# Patient Record
Sex: Female | Born: 1935
Health system: Southern US, Community
[De-identification: ages and names within clinical notes are randomized; demographics above are authoritative.]

## PROBLEM LIST (undated history)

## (undated) DIAGNOSIS — M1990 Unspecified osteoarthritis, unspecified site: Secondary | ICD-10-CM

## (undated) DIAGNOSIS — I10 Essential (primary) hypertension: Secondary | ICD-10-CM

## (undated) DIAGNOSIS — E119 Type 2 diabetes mellitus without complications: Secondary | ICD-10-CM

## (undated) HISTORY — PX: LUNG SURGERY: SHX703

## (undated) HISTORY — PX: SHOULDER SURGERY: SHX246

## (undated) HISTORY — PX: KNEE SURGERY: SHX244

## (undated) HISTORY — PX: HIP SURGERY: SHX245

## (undated) HISTORY — DX: Essential (primary) hypertension: I10

## (undated) HISTORY — PX: ABDOMINAL HYSTERECTOMY: SHX81

## (undated) HISTORY — DX: Unspecified osteoarthritis, unspecified site: M19.90

## (undated) HISTORY — DX: Type 2 diabetes mellitus without complications: E11.9

---

## 2014-08-22 DIAGNOSIS — I10 Essential (primary) hypertension: Secondary | ICD-10-CM | POA: Diagnosis not present

## 2014-08-22 DIAGNOSIS — M1990 Unspecified osteoarthritis, unspecified site: Secondary | ICD-10-CM | POA: Diagnosis not present

## 2014-08-22 DIAGNOSIS — E119 Type 2 diabetes mellitus without complications: Secondary | ICD-10-CM | POA: Diagnosis not present

## 2014-08-22 DIAGNOSIS — E785 Hyperlipidemia, unspecified: Secondary | ICD-10-CM | POA: Diagnosis not present

## 2015-02-22 DIAGNOSIS — E119 Type 2 diabetes mellitus without complications: Secondary | ICD-10-CM | POA: Diagnosis not present

## 2015-02-22 DIAGNOSIS — E785 Hyperlipidemia, unspecified: Secondary | ICD-10-CM | POA: Diagnosis not present

## 2015-02-22 DIAGNOSIS — Z23 Encounter for immunization: Secondary | ICD-10-CM | POA: Diagnosis not present

## 2015-02-22 DIAGNOSIS — E559 Vitamin D deficiency, unspecified: Secondary | ICD-10-CM | POA: Diagnosis not present

## 2015-02-22 DIAGNOSIS — R799 Abnormal finding of blood chemistry, unspecified: Secondary | ICD-10-CM | POA: Diagnosis not present

## 2015-02-22 DIAGNOSIS — M150 Primary generalized (osteo)arthritis: Secondary | ICD-10-CM | POA: Diagnosis not present

## 2015-02-22 DIAGNOSIS — I10 Essential (primary) hypertension: Secondary | ICD-10-CM | POA: Diagnosis not present

## 2015-03-28 DIAGNOSIS — I10 Essential (primary) hypertension: Secondary | ICD-10-CM | POA: Diagnosis not present

## 2015-03-28 DIAGNOSIS — E119 Type 2 diabetes mellitus without complications: Secondary | ICD-10-CM | POA: Diagnosis not present

## 2015-03-28 DIAGNOSIS — M25562 Pain in left knee: Secondary | ICD-10-CM | POA: Diagnosis not present

## 2015-03-28 DIAGNOSIS — M25561 Pain in right knee: Secondary | ICD-10-CM | POA: Diagnosis not present

## 2015-04-17 DIAGNOSIS — E119 Type 2 diabetes mellitus without complications: Secondary | ICD-10-CM | POA: Diagnosis not present

## 2015-04-17 DIAGNOSIS — R350 Frequency of micturition: Secondary | ICD-10-CM | POA: Diagnosis not present

## 2015-04-17 DIAGNOSIS — Z7984 Long term (current) use of oral hypoglycemic drugs: Secondary | ICD-10-CM | POA: Diagnosis not present

## 2015-04-17 DIAGNOSIS — N390 Urinary tract infection, site not specified: Secondary | ICD-10-CM | POA: Diagnosis not present

## 2015-06-08 DIAGNOSIS — Z789 Other specified health status: Secondary | ICD-10-CM | POA: Diagnosis not present

## 2015-06-08 DIAGNOSIS — R2689 Other abnormalities of gait and mobility: Secondary | ICD-10-CM | POA: Diagnosis not present

## 2015-06-08 DIAGNOSIS — R262 Difficulty in walking, not elsewhere classified: Secondary | ICD-10-CM | POA: Diagnosis not present

## 2015-06-08 DIAGNOSIS — M150 Primary generalized (osteo)arthritis: Secondary | ICD-10-CM | POA: Diagnosis not present

## 2015-08-30 DIAGNOSIS — Z79899 Other long term (current) drug therapy: Secondary | ICD-10-CM | POA: Diagnosis not present

## 2015-08-30 DIAGNOSIS — Z7984 Long term (current) use of oral hypoglycemic drugs: Secondary | ICD-10-CM | POA: Diagnosis not present

## 2015-08-30 DIAGNOSIS — I10 Essential (primary) hypertension: Secondary | ICD-10-CM | POA: Diagnosis not present

## 2015-08-30 DIAGNOSIS — E7800 Pure hypercholesterolemia, unspecified: Secondary | ICD-10-CM | POA: Diagnosis not present

## 2015-08-30 DIAGNOSIS — M150 Primary generalized (osteo)arthritis: Secondary | ICD-10-CM | POA: Diagnosis not present

## 2015-08-30 DIAGNOSIS — R2689 Other abnormalities of gait and mobility: Secondary | ICD-10-CM | POA: Diagnosis not present

## 2015-08-30 DIAGNOSIS — E119 Type 2 diabetes mellitus without complications: Secondary | ICD-10-CM | POA: Diagnosis not present

## 2015-09-06 DIAGNOSIS — E119 Type 2 diabetes mellitus without complications: Secondary | ICD-10-CM | POA: Diagnosis not present

## 2015-09-06 DIAGNOSIS — Z7984 Long term (current) use of oral hypoglycemic drugs: Secondary | ICD-10-CM | POA: Diagnosis not present

## 2015-09-06 DIAGNOSIS — R2689 Other abnormalities of gait and mobility: Secondary | ICD-10-CM | POA: Diagnosis not present

## 2015-09-06 DIAGNOSIS — M160 Bilateral primary osteoarthritis of hip: Secondary | ICD-10-CM | POA: Diagnosis not present

## 2015-09-06 DIAGNOSIS — M170 Bilateral primary osteoarthritis of knee: Secondary | ICD-10-CM | POA: Diagnosis not present

## 2015-09-06 DIAGNOSIS — M4806 Spinal stenosis, lumbar region: Secondary | ICD-10-CM | POA: Diagnosis not present

## 2015-09-06 DIAGNOSIS — I10 Essential (primary) hypertension: Secondary | ICD-10-CM | POA: Diagnosis not present

## 2015-09-12 DIAGNOSIS — E119 Type 2 diabetes mellitus without complications: Secondary | ICD-10-CM | POA: Diagnosis not present

## 2015-09-12 DIAGNOSIS — I10 Essential (primary) hypertension: Secondary | ICD-10-CM | POA: Diagnosis not present

## 2015-09-12 DIAGNOSIS — R2689 Other abnormalities of gait and mobility: Secondary | ICD-10-CM | POA: Diagnosis not present

## 2015-09-12 DIAGNOSIS — M4806 Spinal stenosis, lumbar region: Secondary | ICD-10-CM | POA: Diagnosis not present

## 2015-09-12 DIAGNOSIS — M160 Bilateral primary osteoarthritis of hip: Secondary | ICD-10-CM | POA: Diagnosis not present

## 2015-09-12 DIAGNOSIS — M170 Bilateral primary osteoarthritis of knee: Secondary | ICD-10-CM | POA: Diagnosis not present

## 2015-09-21 DIAGNOSIS — I10 Essential (primary) hypertension: Secondary | ICD-10-CM | POA: Diagnosis not present

## 2015-09-21 DIAGNOSIS — M160 Bilateral primary osteoarthritis of hip: Secondary | ICD-10-CM | POA: Diagnosis not present

## 2015-09-21 DIAGNOSIS — R2689 Other abnormalities of gait and mobility: Secondary | ICD-10-CM | POA: Diagnosis not present

## 2015-09-21 DIAGNOSIS — M170 Bilateral primary osteoarthritis of knee: Secondary | ICD-10-CM | POA: Diagnosis not present

## 2015-09-21 DIAGNOSIS — M4806 Spinal stenosis, lumbar region: Secondary | ICD-10-CM | POA: Diagnosis not present

## 2015-09-21 DIAGNOSIS — E119 Type 2 diabetes mellitus without complications: Secondary | ICD-10-CM | POA: Diagnosis not present

## 2015-09-22 DIAGNOSIS — I10 Essential (primary) hypertension: Secondary | ICD-10-CM | POA: Diagnosis not present

## 2015-09-22 DIAGNOSIS — M4806 Spinal stenosis, lumbar region: Secondary | ICD-10-CM | POA: Diagnosis not present

## 2015-09-22 DIAGNOSIS — M170 Bilateral primary osteoarthritis of knee: Secondary | ICD-10-CM | POA: Diagnosis not present

## 2015-09-22 DIAGNOSIS — E119 Type 2 diabetes mellitus without complications: Secondary | ICD-10-CM | POA: Diagnosis not present

## 2015-09-22 DIAGNOSIS — R2689 Other abnormalities of gait and mobility: Secondary | ICD-10-CM | POA: Diagnosis not present

## 2015-09-22 DIAGNOSIS — M160 Bilateral primary osteoarthritis of hip: Secondary | ICD-10-CM | POA: Diagnosis not present

## 2015-09-25 DIAGNOSIS — M4806 Spinal stenosis, lumbar region: Secondary | ICD-10-CM | POA: Diagnosis not present

## 2015-09-25 DIAGNOSIS — M160 Bilateral primary osteoarthritis of hip: Secondary | ICD-10-CM | POA: Diagnosis not present

## 2015-09-25 DIAGNOSIS — R2689 Other abnormalities of gait and mobility: Secondary | ICD-10-CM | POA: Diagnosis not present

## 2015-09-25 DIAGNOSIS — M170 Bilateral primary osteoarthritis of knee: Secondary | ICD-10-CM | POA: Diagnosis not present

## 2015-09-25 DIAGNOSIS — I10 Essential (primary) hypertension: Secondary | ICD-10-CM | POA: Diagnosis not present

## 2015-09-25 DIAGNOSIS — E119 Type 2 diabetes mellitus without complications: Secondary | ICD-10-CM | POA: Diagnosis not present

## 2015-09-28 DIAGNOSIS — R2689 Other abnormalities of gait and mobility: Secondary | ICD-10-CM | POA: Diagnosis not present

## 2015-09-28 DIAGNOSIS — I10 Essential (primary) hypertension: Secondary | ICD-10-CM | POA: Diagnosis not present

## 2015-09-28 DIAGNOSIS — M170 Bilateral primary osteoarthritis of knee: Secondary | ICD-10-CM | POA: Diagnosis not present

## 2015-09-28 DIAGNOSIS — M160 Bilateral primary osteoarthritis of hip: Secondary | ICD-10-CM | POA: Diagnosis not present

## 2015-09-28 DIAGNOSIS — E119 Type 2 diabetes mellitus without complications: Secondary | ICD-10-CM | POA: Diagnosis not present

## 2015-09-28 DIAGNOSIS — M4806 Spinal stenosis, lumbar region: Secondary | ICD-10-CM | POA: Diagnosis not present

## 2015-09-29 DIAGNOSIS — E119 Type 2 diabetes mellitus without complications: Secondary | ICD-10-CM | POA: Diagnosis not present

## 2015-09-29 DIAGNOSIS — Z7984 Long term (current) use of oral hypoglycemic drugs: Secondary | ICD-10-CM | POA: Diagnosis not present

## 2015-09-29 DIAGNOSIS — I10 Essential (primary) hypertension: Secondary | ICD-10-CM | POA: Diagnosis not present

## 2015-09-29 DIAGNOSIS — M150 Primary generalized (osteo)arthritis: Secondary | ICD-10-CM | POA: Diagnosis not present

## 2015-09-29 DIAGNOSIS — R2689 Other abnormalities of gait and mobility: Secondary | ICD-10-CM | POA: Diagnosis not present

## 2015-10-02 DIAGNOSIS — R2689 Other abnormalities of gait and mobility: Secondary | ICD-10-CM | POA: Diagnosis not present

## 2015-10-02 DIAGNOSIS — M160 Bilateral primary osteoarthritis of hip: Secondary | ICD-10-CM | POA: Diagnosis not present

## 2015-10-02 DIAGNOSIS — I10 Essential (primary) hypertension: Secondary | ICD-10-CM | POA: Diagnosis not present

## 2015-10-02 DIAGNOSIS — M4806 Spinal stenosis, lumbar region: Secondary | ICD-10-CM | POA: Diagnosis not present

## 2015-10-02 DIAGNOSIS — E119 Type 2 diabetes mellitus without complications: Secondary | ICD-10-CM | POA: Diagnosis not present

## 2015-10-02 DIAGNOSIS — M170 Bilateral primary osteoarthritis of knee: Secondary | ICD-10-CM | POA: Diagnosis not present

## 2015-10-04 DIAGNOSIS — M160 Bilateral primary osteoarthritis of hip: Secondary | ICD-10-CM | POA: Diagnosis not present

## 2015-10-04 DIAGNOSIS — I10 Essential (primary) hypertension: Secondary | ICD-10-CM | POA: Diagnosis not present

## 2015-10-04 DIAGNOSIS — R2689 Other abnormalities of gait and mobility: Secondary | ICD-10-CM | POA: Diagnosis not present

## 2015-10-04 DIAGNOSIS — E119 Type 2 diabetes mellitus without complications: Secondary | ICD-10-CM | POA: Diagnosis not present

## 2015-10-04 DIAGNOSIS — M170 Bilateral primary osteoarthritis of knee: Secondary | ICD-10-CM | POA: Diagnosis not present

## 2015-10-04 DIAGNOSIS — M4806 Spinal stenosis, lumbar region: Secondary | ICD-10-CM | POA: Diagnosis not present

## 2015-10-09 DIAGNOSIS — M160 Bilateral primary osteoarthritis of hip: Secondary | ICD-10-CM | POA: Diagnosis not present

## 2015-10-09 DIAGNOSIS — I10 Essential (primary) hypertension: Secondary | ICD-10-CM | POA: Diagnosis not present

## 2015-10-09 DIAGNOSIS — M4806 Spinal stenosis, lumbar region: Secondary | ICD-10-CM | POA: Diagnosis not present

## 2015-10-09 DIAGNOSIS — R2689 Other abnormalities of gait and mobility: Secondary | ICD-10-CM | POA: Diagnosis not present

## 2015-10-09 DIAGNOSIS — M170 Bilateral primary osteoarthritis of knee: Secondary | ICD-10-CM | POA: Diagnosis not present

## 2015-10-09 DIAGNOSIS — E119 Type 2 diabetes mellitus without complications: Secondary | ICD-10-CM | POA: Diagnosis not present

## 2015-10-11 DIAGNOSIS — M160 Bilateral primary osteoarthritis of hip: Secondary | ICD-10-CM | POA: Diagnosis not present

## 2015-10-11 DIAGNOSIS — R2689 Other abnormalities of gait and mobility: Secondary | ICD-10-CM | POA: Diagnosis not present

## 2015-10-11 DIAGNOSIS — M170 Bilateral primary osteoarthritis of knee: Secondary | ICD-10-CM | POA: Diagnosis not present

## 2015-10-11 DIAGNOSIS — I10 Essential (primary) hypertension: Secondary | ICD-10-CM | POA: Diagnosis not present

## 2015-10-11 DIAGNOSIS — M4806 Spinal stenosis, lumbar region: Secondary | ICD-10-CM | POA: Diagnosis not present

## 2015-10-11 DIAGNOSIS — E119 Type 2 diabetes mellitus without complications: Secondary | ICD-10-CM | POA: Diagnosis not present

## 2015-10-16 DIAGNOSIS — M4806 Spinal stenosis, lumbar region: Secondary | ICD-10-CM | POA: Diagnosis not present

## 2015-10-16 DIAGNOSIS — E119 Type 2 diabetes mellitus without complications: Secondary | ICD-10-CM | POA: Diagnosis not present

## 2015-10-16 DIAGNOSIS — M170 Bilateral primary osteoarthritis of knee: Secondary | ICD-10-CM | POA: Diagnosis not present

## 2015-10-16 DIAGNOSIS — I10 Essential (primary) hypertension: Secondary | ICD-10-CM | POA: Diagnosis not present

## 2015-10-16 DIAGNOSIS — R2689 Other abnormalities of gait and mobility: Secondary | ICD-10-CM | POA: Diagnosis not present

## 2015-10-16 DIAGNOSIS — M160 Bilateral primary osteoarthritis of hip: Secondary | ICD-10-CM | POA: Diagnosis not present

## 2015-10-18 DIAGNOSIS — M4806 Spinal stenosis, lumbar region: Secondary | ICD-10-CM | POA: Diagnosis not present

## 2015-10-18 DIAGNOSIS — E119 Type 2 diabetes mellitus without complications: Secondary | ICD-10-CM | POA: Diagnosis not present

## 2015-10-18 DIAGNOSIS — R2689 Other abnormalities of gait and mobility: Secondary | ICD-10-CM | POA: Diagnosis not present

## 2015-10-18 DIAGNOSIS — I10 Essential (primary) hypertension: Secondary | ICD-10-CM | POA: Diagnosis not present

## 2015-10-18 DIAGNOSIS — M160 Bilateral primary osteoarthritis of hip: Secondary | ICD-10-CM | POA: Diagnosis not present

## 2015-10-18 DIAGNOSIS — M170 Bilateral primary osteoarthritis of knee: Secondary | ICD-10-CM | POA: Diagnosis not present

## 2015-10-19 DIAGNOSIS — R2689 Other abnormalities of gait and mobility: Secondary | ICD-10-CM | POA: Diagnosis not present

## 2015-10-19 DIAGNOSIS — M160 Bilateral primary osteoarthritis of hip: Secondary | ICD-10-CM | POA: Diagnosis not present

## 2015-10-19 DIAGNOSIS — E119 Type 2 diabetes mellitus without complications: Secondary | ICD-10-CM | POA: Diagnosis not present

## 2015-10-19 DIAGNOSIS — I10 Essential (primary) hypertension: Secondary | ICD-10-CM | POA: Diagnosis not present

## 2015-10-19 DIAGNOSIS — M4806 Spinal stenosis, lumbar region: Secondary | ICD-10-CM | POA: Diagnosis not present

## 2015-10-19 DIAGNOSIS — M170 Bilateral primary osteoarthritis of knee: Secondary | ICD-10-CM | POA: Diagnosis not present

## 2015-10-23 DIAGNOSIS — M4806 Spinal stenosis, lumbar region: Secondary | ICD-10-CM | POA: Diagnosis not present

## 2015-10-23 DIAGNOSIS — M160 Bilateral primary osteoarthritis of hip: Secondary | ICD-10-CM | POA: Diagnosis not present

## 2015-10-23 DIAGNOSIS — I10 Essential (primary) hypertension: Secondary | ICD-10-CM | POA: Diagnosis not present

## 2015-10-23 DIAGNOSIS — R2689 Other abnormalities of gait and mobility: Secondary | ICD-10-CM | POA: Diagnosis not present

## 2015-10-23 DIAGNOSIS — E119 Type 2 diabetes mellitus without complications: Secondary | ICD-10-CM | POA: Diagnosis not present

## 2015-10-23 DIAGNOSIS — M170 Bilateral primary osteoarthritis of knee: Secondary | ICD-10-CM | POA: Diagnosis not present

## 2015-10-25 DIAGNOSIS — R2689 Other abnormalities of gait and mobility: Secondary | ICD-10-CM | POA: Diagnosis not present

## 2015-10-25 DIAGNOSIS — E119 Type 2 diabetes mellitus without complications: Secondary | ICD-10-CM | POA: Diagnosis not present

## 2015-10-25 DIAGNOSIS — M160 Bilateral primary osteoarthritis of hip: Secondary | ICD-10-CM | POA: Diagnosis not present

## 2015-10-25 DIAGNOSIS — I10 Essential (primary) hypertension: Secondary | ICD-10-CM | POA: Diagnosis not present

## 2015-10-25 DIAGNOSIS — M4806 Spinal stenosis, lumbar region: Secondary | ICD-10-CM | POA: Diagnosis not present

## 2015-10-25 DIAGNOSIS — M170 Bilateral primary osteoarthritis of knee: Secondary | ICD-10-CM | POA: Diagnosis not present

## 2015-10-30 DIAGNOSIS — E119 Type 2 diabetes mellitus without complications: Secondary | ICD-10-CM | POA: Diagnosis not present

## 2015-10-30 DIAGNOSIS — R2689 Other abnormalities of gait and mobility: Secondary | ICD-10-CM | POA: Diagnosis not present

## 2015-10-30 DIAGNOSIS — M170 Bilateral primary osteoarthritis of knee: Secondary | ICD-10-CM | POA: Diagnosis not present

## 2015-10-30 DIAGNOSIS — M4806 Spinal stenosis, lumbar region: Secondary | ICD-10-CM | POA: Diagnosis not present

## 2015-10-30 DIAGNOSIS — M160 Bilateral primary osteoarthritis of hip: Secondary | ICD-10-CM | POA: Diagnosis not present

## 2015-10-30 DIAGNOSIS — I10 Essential (primary) hypertension: Secondary | ICD-10-CM | POA: Diagnosis not present

## 2015-11-01 DIAGNOSIS — M4806 Spinal stenosis, lumbar region: Secondary | ICD-10-CM | POA: Diagnosis not present

## 2015-11-01 DIAGNOSIS — I10 Essential (primary) hypertension: Secondary | ICD-10-CM | POA: Diagnosis not present

## 2015-11-01 DIAGNOSIS — M170 Bilateral primary osteoarthritis of knee: Secondary | ICD-10-CM | POA: Diagnosis not present

## 2015-11-01 DIAGNOSIS — R2689 Other abnormalities of gait and mobility: Secondary | ICD-10-CM | POA: Diagnosis not present

## 2015-11-01 DIAGNOSIS — M160 Bilateral primary osteoarthritis of hip: Secondary | ICD-10-CM | POA: Diagnosis not present

## 2015-11-01 DIAGNOSIS — E119 Type 2 diabetes mellitus without complications: Secondary | ICD-10-CM | POA: Diagnosis not present

## 2015-11-02 DIAGNOSIS — M170 Bilateral primary osteoarthritis of knee: Secondary | ICD-10-CM | POA: Diagnosis not present

## 2015-11-02 DIAGNOSIS — M160 Bilateral primary osteoarthritis of hip: Secondary | ICD-10-CM | POA: Diagnosis not present

## 2015-11-02 DIAGNOSIS — I10 Essential (primary) hypertension: Secondary | ICD-10-CM | POA: Diagnosis not present

## 2015-11-02 DIAGNOSIS — E119 Type 2 diabetes mellitus without complications: Secondary | ICD-10-CM | POA: Diagnosis not present

## 2015-11-02 DIAGNOSIS — M4806 Spinal stenosis, lumbar region: Secondary | ICD-10-CM | POA: Diagnosis not present

## 2015-11-02 DIAGNOSIS — R2689 Other abnormalities of gait and mobility: Secondary | ICD-10-CM | POA: Diagnosis not present

## 2015-12-04 DIAGNOSIS — E119 Type 2 diabetes mellitus without complications: Secondary | ICD-10-CM | POA: Diagnosis not present

## 2015-12-04 DIAGNOSIS — Z7984 Long term (current) use of oral hypoglycemic drugs: Secondary | ICD-10-CM | POA: Diagnosis not present

## 2015-12-25 DIAGNOSIS — Z79899 Other long term (current) drug therapy: Secondary | ICD-10-CM | POA: Diagnosis not present

## 2015-12-25 DIAGNOSIS — I10 Essential (primary) hypertension: Secondary | ICD-10-CM | POA: Diagnosis not present

## 2015-12-25 DIAGNOSIS — E119 Type 2 diabetes mellitus without complications: Secondary | ICD-10-CM | POA: Diagnosis not present

## 2015-12-25 DIAGNOSIS — R600 Localized edema: Secondary | ICD-10-CM | POA: Diagnosis not present

## 2016-01-08 DIAGNOSIS — Z79899 Other long term (current) drug therapy: Secondary | ICD-10-CM | POA: Diagnosis not present

## 2016-02-25 DIAGNOSIS — Z23 Encounter for immunization: Secondary | ICD-10-CM | POA: Diagnosis not present

## 2016-03-05 DIAGNOSIS — E119 Type 2 diabetes mellitus without complications: Secondary | ICD-10-CM | POA: Diagnosis not present

## 2016-03-05 DIAGNOSIS — N951 Menopausal and female climacteric states: Secondary | ICD-10-CM | POA: Diagnosis not present

## 2016-03-05 DIAGNOSIS — Z79899 Other long term (current) drug therapy: Secondary | ICD-10-CM | POA: Diagnosis not present

## 2016-03-05 DIAGNOSIS — E7800 Pure hypercholesterolemia, unspecified: Secondary | ICD-10-CM | POA: Diagnosis not present

## 2016-03-05 DIAGNOSIS — Z0001 Encounter for general adult medical examination with abnormal findings: Secondary | ICD-10-CM | POA: Diagnosis not present

## 2016-03-05 DIAGNOSIS — I10 Essential (primary) hypertension: Secondary | ICD-10-CM | POA: Diagnosis not present

## 2016-03-05 DIAGNOSIS — M150 Primary generalized (osteo)arthritis: Secondary | ICD-10-CM | POA: Diagnosis not present

## 2016-03-05 DIAGNOSIS — Z7984 Long term (current) use of oral hypoglycemic drugs: Secondary | ICD-10-CM | POA: Diagnosis not present

## 2016-04-01 DIAGNOSIS — Z7984 Long term (current) use of oral hypoglycemic drugs: Secondary | ICD-10-CM | POA: Diagnosis not present

## 2016-04-01 DIAGNOSIS — R296 Repeated falls: Secondary | ICD-10-CM | POA: Diagnosis not present

## 2016-04-01 DIAGNOSIS — E119 Type 2 diabetes mellitus without complications: Secondary | ICD-10-CM | POA: Diagnosis not present

## 2016-04-01 DIAGNOSIS — Z9181 History of falling: Secondary | ICD-10-CM | POA: Diagnosis not present

## 2016-04-01 DIAGNOSIS — I10 Essential (primary) hypertension: Secondary | ICD-10-CM | POA: Diagnosis not present

## 2016-04-04 DIAGNOSIS — I10 Essential (primary) hypertension: Secondary | ICD-10-CM | POA: Diagnosis not present

## 2016-04-04 DIAGNOSIS — R296 Repeated falls: Secondary | ICD-10-CM | POA: Diagnosis not present

## 2016-04-05 DIAGNOSIS — R0602 Shortness of breath: Secondary | ICD-10-CM | POA: Diagnosis not present

## 2016-04-08 DIAGNOSIS — I10 Essential (primary) hypertension: Secondary | ICD-10-CM | POA: Diagnosis not present

## 2016-04-08 DIAGNOSIS — R296 Repeated falls: Secondary | ICD-10-CM | POA: Diagnosis not present

## 2016-04-09 ENCOUNTER — Other Ambulatory Visit: Payer: Self-pay | Admitting: Family Medicine

## 2016-04-09 ENCOUNTER — Ambulatory Visit
Admission: RE | Admit: 2016-04-09 | Discharge: 2016-04-09 | Disposition: A | Discharge status: Home / Self Care | Payer: Medicare Other | Source: Ambulatory Visit | Attending: Family Medicine | Admitting: Family Medicine

## 2016-04-09 DIAGNOSIS — R0602 Shortness of breath: Secondary | ICD-10-CM | POA: Diagnosis not present

## 2016-04-11 DIAGNOSIS — I10 Essential (primary) hypertension: Secondary | ICD-10-CM | POA: Diagnosis not present

## 2016-04-11 DIAGNOSIS — R296 Repeated falls: Secondary | ICD-10-CM | POA: Diagnosis not present

## 2016-04-15 DIAGNOSIS — R296 Repeated falls: Secondary | ICD-10-CM | POA: Diagnosis not present

## 2016-04-15 DIAGNOSIS — I10 Essential (primary) hypertension: Secondary | ICD-10-CM | POA: Diagnosis not present

## 2016-04-17 DIAGNOSIS — R296 Repeated falls: Secondary | ICD-10-CM | POA: Diagnosis not present

## 2016-04-17 DIAGNOSIS — I10 Essential (primary) hypertension: Secondary | ICD-10-CM | POA: Diagnosis not present

## 2016-04-22 DIAGNOSIS — L602 Onychogryphosis: Secondary | ICD-10-CM | POA: Diagnosis not present

## 2016-04-22 DIAGNOSIS — M216X2 Other acquired deformities of left foot: Secondary | ICD-10-CM | POA: Diagnosis not present

## 2016-04-22 DIAGNOSIS — B351 Tinea unguium: Secondary | ICD-10-CM | POA: Diagnosis not present

## 2016-04-22 DIAGNOSIS — R296 Repeated falls: Secondary | ICD-10-CM | POA: Diagnosis not present

## 2016-04-22 DIAGNOSIS — M216X1 Other acquired deformities of right foot: Secondary | ICD-10-CM | POA: Diagnosis not present

## 2016-04-22 DIAGNOSIS — E1151 Type 2 diabetes mellitus with diabetic peripheral angiopathy without gangrene: Secondary | ICD-10-CM | POA: Diagnosis not present

## 2016-04-22 DIAGNOSIS — I10 Essential (primary) hypertension: Secondary | ICD-10-CM | POA: Diagnosis not present

## 2016-04-24 DIAGNOSIS — I10 Essential (primary) hypertension: Secondary | ICD-10-CM | POA: Diagnosis not present

## 2016-04-24 DIAGNOSIS — R296 Repeated falls: Secondary | ICD-10-CM | POA: Diagnosis not present

## 2016-04-29 DIAGNOSIS — R296 Repeated falls: Secondary | ICD-10-CM | POA: Diagnosis not present

## 2016-04-29 DIAGNOSIS — I10 Essential (primary) hypertension: Secondary | ICD-10-CM | POA: Diagnosis not present

## 2016-05-01 DIAGNOSIS — B351 Tinea unguium: Secondary | ICD-10-CM | POA: Diagnosis not present

## 2016-05-01 DIAGNOSIS — M810 Age-related osteoporosis without current pathological fracture: Secondary | ICD-10-CM | POA: Diagnosis not present

## 2016-05-01 DIAGNOSIS — Z780 Asymptomatic menopausal state: Secondary | ICD-10-CM | POA: Diagnosis not present

## 2016-05-02 DIAGNOSIS — R296 Repeated falls: Secondary | ICD-10-CM | POA: Diagnosis not present

## 2016-05-02 DIAGNOSIS — I10 Essential (primary) hypertension: Secondary | ICD-10-CM | POA: Diagnosis not present

## 2016-05-07 DIAGNOSIS — I10 Essential (primary) hypertension: Secondary | ICD-10-CM | POA: Diagnosis not present

## 2016-05-09 DIAGNOSIS — I10 Essential (primary) hypertension: Secondary | ICD-10-CM | POA: Diagnosis not present

## 2016-05-09 DIAGNOSIS — R296 Repeated falls: Secondary | ICD-10-CM | POA: Diagnosis not present

## 2016-05-15 DIAGNOSIS — L602 Onychogryphosis: Secondary | ICD-10-CM | POA: Diagnosis not present

## 2016-05-15 DIAGNOSIS — E119 Type 2 diabetes mellitus without complications: Secondary | ICD-10-CM | POA: Diagnosis not present

## 2016-05-20 DIAGNOSIS — I10 Essential (primary) hypertension: Secondary | ICD-10-CM | POA: Diagnosis not present

## 2016-05-20 DIAGNOSIS — R296 Repeated falls: Secondary | ICD-10-CM | POA: Diagnosis not present

## 2016-05-30 DIAGNOSIS — R296 Repeated falls: Secondary | ICD-10-CM | POA: Diagnosis not present

## 2016-05-30 DIAGNOSIS — I10 Essential (primary) hypertension: Secondary | ICD-10-CM | POA: Diagnosis not present

## 2016-05-31 DIAGNOSIS — E119 Type 2 diabetes mellitus without complications: Secondary | ICD-10-CM | POA: Diagnosis not present

## 2016-05-31 DIAGNOSIS — M150 Primary generalized (osteo)arthritis: Secondary | ICD-10-CM | POA: Diagnosis not present

## 2016-05-31 DIAGNOSIS — I10 Essential (primary) hypertension: Secondary | ICD-10-CM | POA: Diagnosis not present

## 2016-05-31 DIAGNOSIS — R2689 Other abnormalities of gait and mobility: Secondary | ICD-10-CM | POA: Diagnosis not present

## 2016-06-05 DIAGNOSIS — R2689 Other abnormalities of gait and mobility: Secondary | ICD-10-CM | POA: Diagnosis not present

## 2016-06-05 DIAGNOSIS — M150 Primary generalized (osteo)arthritis: Secondary | ICD-10-CM | POA: Diagnosis not present

## 2016-06-05 DIAGNOSIS — H52223 Regular astigmatism, bilateral: Secondary | ICD-10-CM | POA: Diagnosis not present

## 2016-06-05 DIAGNOSIS — H5203 Hypermetropia, bilateral: Secondary | ICD-10-CM | POA: Diagnosis not present

## 2016-06-05 DIAGNOSIS — Z7984 Long term (current) use of oral hypoglycemic drugs: Secondary | ICD-10-CM | POA: Diagnosis not present

## 2016-06-05 DIAGNOSIS — H524 Presbyopia: Secondary | ICD-10-CM | POA: Diagnosis not present

## 2016-06-05 DIAGNOSIS — E119 Type 2 diabetes mellitus without complications: Secondary | ICD-10-CM | POA: Diagnosis not present

## 2016-06-06 DIAGNOSIS — F320 Major depressive disorder, single episode, mild: Secondary | ICD-10-CM | POA: Diagnosis not present

## 2016-06-06 DIAGNOSIS — E119 Type 2 diabetes mellitus without complications: Secondary | ICD-10-CM | POA: Diagnosis not present

## 2016-06-06 DIAGNOSIS — Z79899 Other long term (current) drug therapy: Secondary | ICD-10-CM | POA: Diagnosis not present

## 2016-06-06 DIAGNOSIS — E1165 Type 2 diabetes mellitus with hyperglycemia: Secondary | ICD-10-CM | POA: Diagnosis not present

## 2016-06-06 DIAGNOSIS — I10 Essential (primary) hypertension: Secondary | ICD-10-CM | POA: Diagnosis not present

## 2016-06-06 DIAGNOSIS — Z7984 Long term (current) use of oral hypoglycemic drugs: Secondary | ICD-10-CM | POA: Diagnosis not present

## 2016-06-11 DIAGNOSIS — R2689 Other abnormalities of gait and mobility: Secondary | ICD-10-CM | POA: Diagnosis not present

## 2016-06-11 DIAGNOSIS — M150 Primary generalized (osteo)arthritis: Secondary | ICD-10-CM | POA: Diagnosis not present

## 2016-06-13 DIAGNOSIS — R2689 Other abnormalities of gait and mobility: Secondary | ICD-10-CM | POA: Diagnosis not present

## 2016-06-13 DIAGNOSIS — M150 Primary generalized (osteo)arthritis: Secondary | ICD-10-CM | POA: Diagnosis not present

## 2016-06-17 DIAGNOSIS — R2689 Other abnormalities of gait and mobility: Secondary | ICD-10-CM | POA: Diagnosis not present

## 2016-06-17 DIAGNOSIS — M150 Primary generalized (osteo)arthritis: Secondary | ICD-10-CM | POA: Diagnosis not present

## 2016-06-24 DIAGNOSIS — Z7984 Long term (current) use of oral hypoglycemic drugs: Secondary | ICD-10-CM | POA: Diagnosis not present

## 2016-06-24 DIAGNOSIS — R2689 Other abnormalities of gait and mobility: Secondary | ICD-10-CM | POA: Diagnosis not present

## 2016-06-24 DIAGNOSIS — I10 Essential (primary) hypertension: Secondary | ICD-10-CM | POA: Diagnosis not present

## 2016-06-24 DIAGNOSIS — M150 Primary generalized (osteo)arthritis: Secondary | ICD-10-CM | POA: Diagnosis not present

## 2016-06-24 DIAGNOSIS — E119 Type 2 diabetes mellitus without complications: Secondary | ICD-10-CM | POA: Diagnosis not present

## 2016-06-24 DIAGNOSIS — J069 Acute upper respiratory infection, unspecified: Secondary | ICD-10-CM | POA: Diagnosis not present

## 2016-06-27 DIAGNOSIS — M150 Primary generalized (osteo)arthritis: Secondary | ICD-10-CM | POA: Diagnosis not present

## 2016-06-27 DIAGNOSIS — R2689 Other abnormalities of gait and mobility: Secondary | ICD-10-CM | POA: Diagnosis not present

## 2016-07-04 DIAGNOSIS — M150 Primary generalized (osteo)arthritis: Secondary | ICD-10-CM | POA: Diagnosis not present

## 2016-07-04 DIAGNOSIS — R2689 Other abnormalities of gait and mobility: Secondary | ICD-10-CM | POA: Diagnosis not present

## 2016-07-11 DIAGNOSIS — M150 Primary generalized (osteo)arthritis: Secondary | ICD-10-CM | POA: Diagnosis not present

## 2016-07-11 DIAGNOSIS — R2689 Other abnormalities of gait and mobility: Secondary | ICD-10-CM | POA: Diagnosis not present

## 2016-07-24 DIAGNOSIS — L602 Onychogryphosis: Secondary | ICD-10-CM | POA: Diagnosis not present

## 2016-07-24 DIAGNOSIS — E1351 Other specified diabetes mellitus with diabetic peripheral angiopathy without gangrene: Secondary | ICD-10-CM | POA: Diagnosis not present

## 2016-07-24 DIAGNOSIS — I70293 Other atherosclerosis of native arteries of extremities, bilateral legs: Secondary | ICD-10-CM | POA: Diagnosis not present

## 2016-09-03 DIAGNOSIS — E1165 Type 2 diabetes mellitus with hyperglycemia: Secondary | ICD-10-CM | POA: Diagnosis not present

## 2016-09-03 DIAGNOSIS — I10 Essential (primary) hypertension: Secondary | ICD-10-CM | POA: Diagnosis not present

## 2016-09-03 DIAGNOSIS — E7800 Pure hypercholesterolemia, unspecified: Secondary | ICD-10-CM | POA: Diagnosis not present

## 2016-09-03 DIAGNOSIS — Z7984 Long term (current) use of oral hypoglycemic drugs: Secondary | ICD-10-CM | POA: Diagnosis not present

## 2016-09-03 DIAGNOSIS — Z79899 Other long term (current) drug therapy: Secondary | ICD-10-CM | POA: Diagnosis not present

## 2016-09-03 DIAGNOSIS — R7309 Other abnormal glucose: Secondary | ICD-10-CM | POA: Diagnosis not present

## 2016-09-03 DIAGNOSIS — E119 Type 2 diabetes mellitus without complications: Secondary | ICD-10-CM | POA: Diagnosis not present

## 2016-09-17 DIAGNOSIS — I10 Essential (primary) hypertension: Secondary | ICD-10-CM | POA: Diagnosis not present

## 2016-09-17 DIAGNOSIS — Z7984 Long term (current) use of oral hypoglycemic drugs: Secondary | ICD-10-CM | POA: Diagnosis not present

## 2016-09-17 DIAGNOSIS — E119 Type 2 diabetes mellitus without complications: Secondary | ICD-10-CM | POA: Diagnosis not present

## 2016-12-03 DIAGNOSIS — E7800 Pure hypercholesterolemia, unspecified: Secondary | ICD-10-CM | POA: Diagnosis not present

## 2016-12-03 DIAGNOSIS — Z79899 Other long term (current) drug therapy: Secondary | ICD-10-CM | POA: Diagnosis not present

## 2016-12-03 DIAGNOSIS — E119 Type 2 diabetes mellitus without complications: Secondary | ICD-10-CM | POA: Diagnosis not present

## 2016-12-03 DIAGNOSIS — I10 Essential (primary) hypertension: Secondary | ICD-10-CM | POA: Diagnosis not present

## 2017-03-09 DIAGNOSIS — Z23 Encounter for immunization: Secondary | ICD-10-CM | POA: Diagnosis not present

## 2017-04-10 DIAGNOSIS — Z0001 Encounter for general adult medical examination with abnormal findings: Secondary | ICD-10-CM | POA: Diagnosis not present

## 2017-04-10 DIAGNOSIS — F320 Major depressive disorder, single episode, mild: Secondary | ICD-10-CM | POA: Diagnosis not present

## 2017-04-10 DIAGNOSIS — E7800 Pure hypercholesterolemia, unspecified: Secondary | ICD-10-CM | POA: Diagnosis not present

## 2017-04-10 DIAGNOSIS — I10 Essential (primary) hypertension: Secondary | ICD-10-CM | POA: Diagnosis not present

## 2017-04-10 DIAGNOSIS — Z79899 Other long term (current) drug therapy: Secondary | ICD-10-CM | POA: Diagnosis not present

## 2017-04-10 DIAGNOSIS — E119 Type 2 diabetes mellitus without complications: Secondary | ICD-10-CM | POA: Diagnosis not present

## 2017-05-19 IMAGING — CR DG CHEST 2V
2 series · 2 of 2 positions shown · non-contrast
Comparison: None.

CLINICAL DATA: Shortness of breath, no chest complaints

EXAM:
CHEST  2 VIEW

[w chest pa]
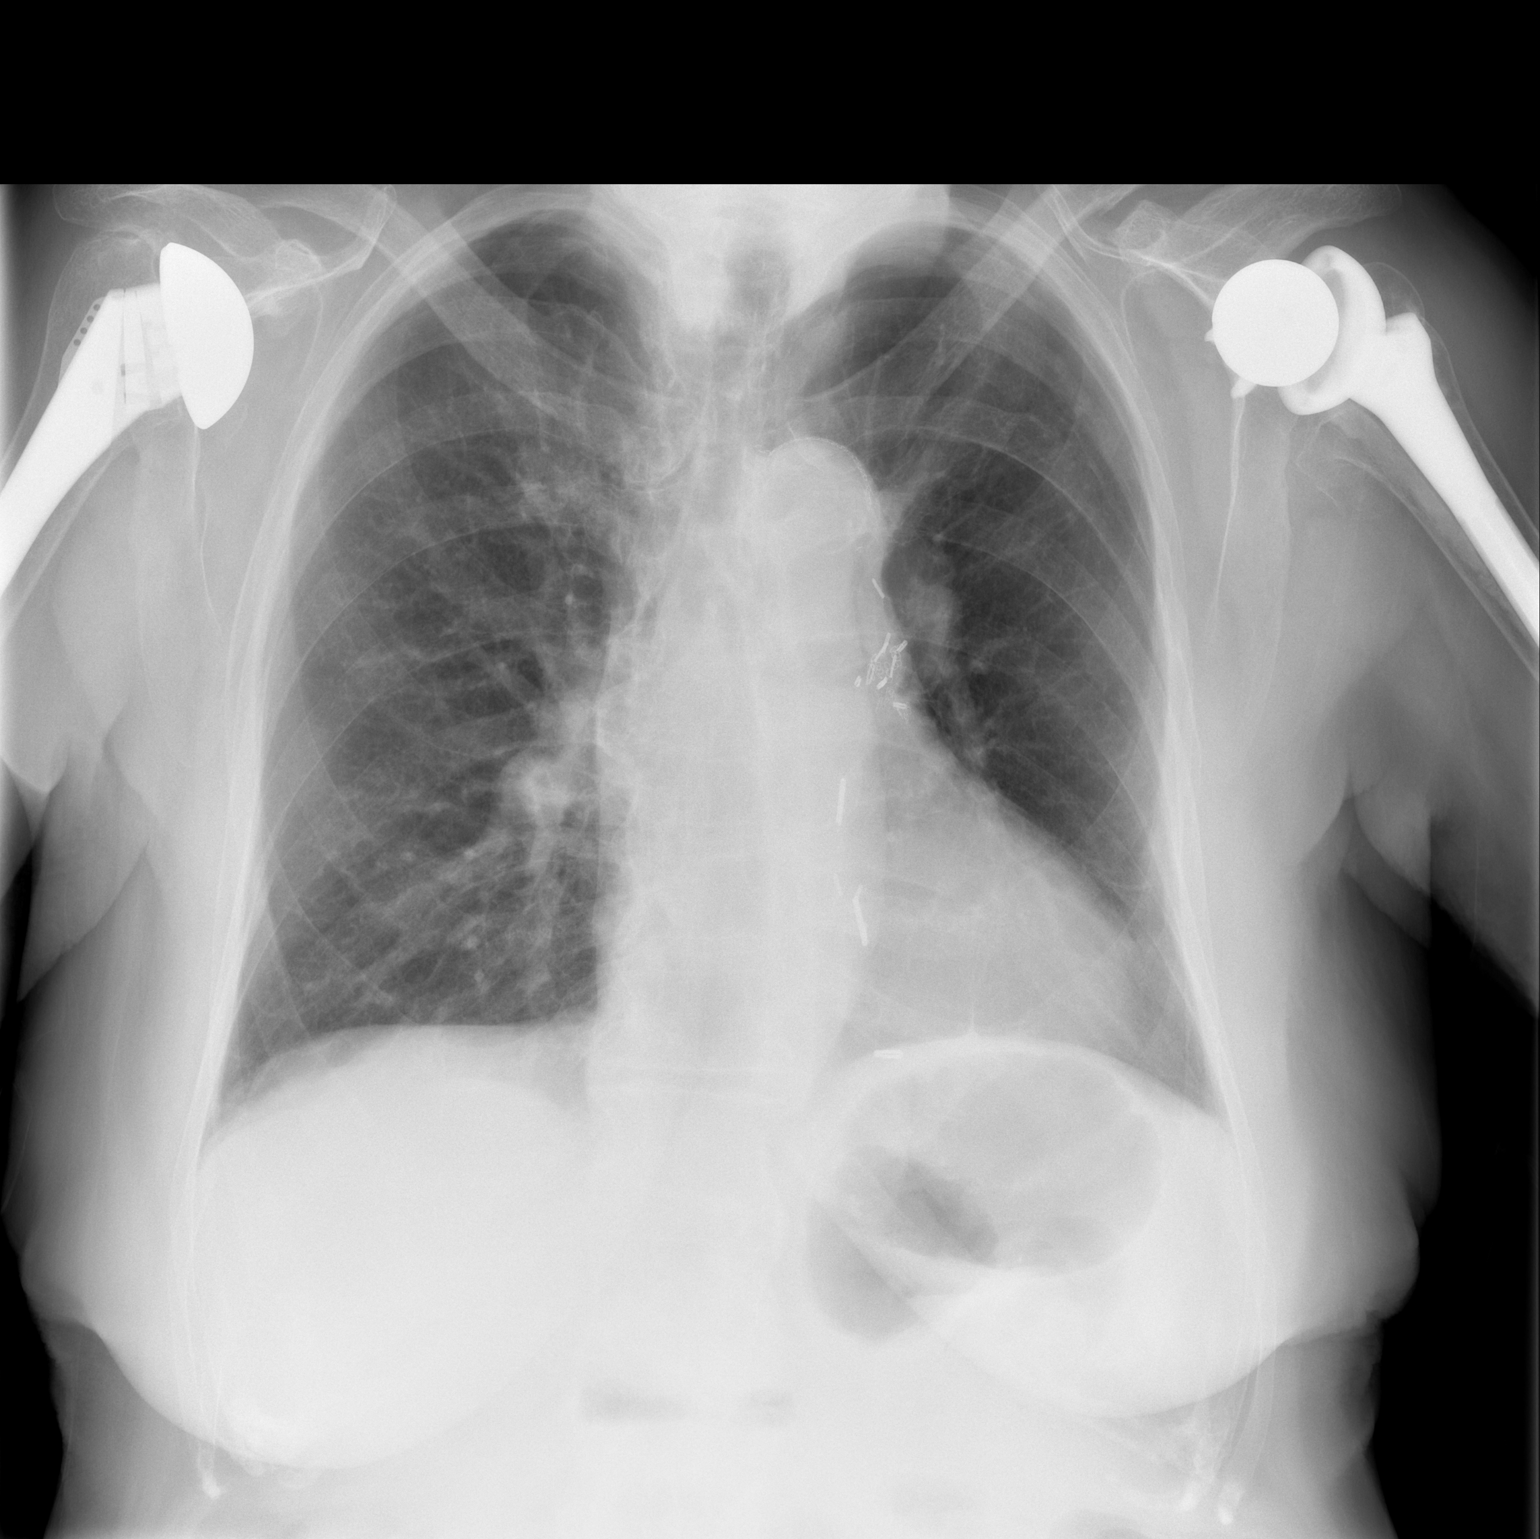

[w chest lat]
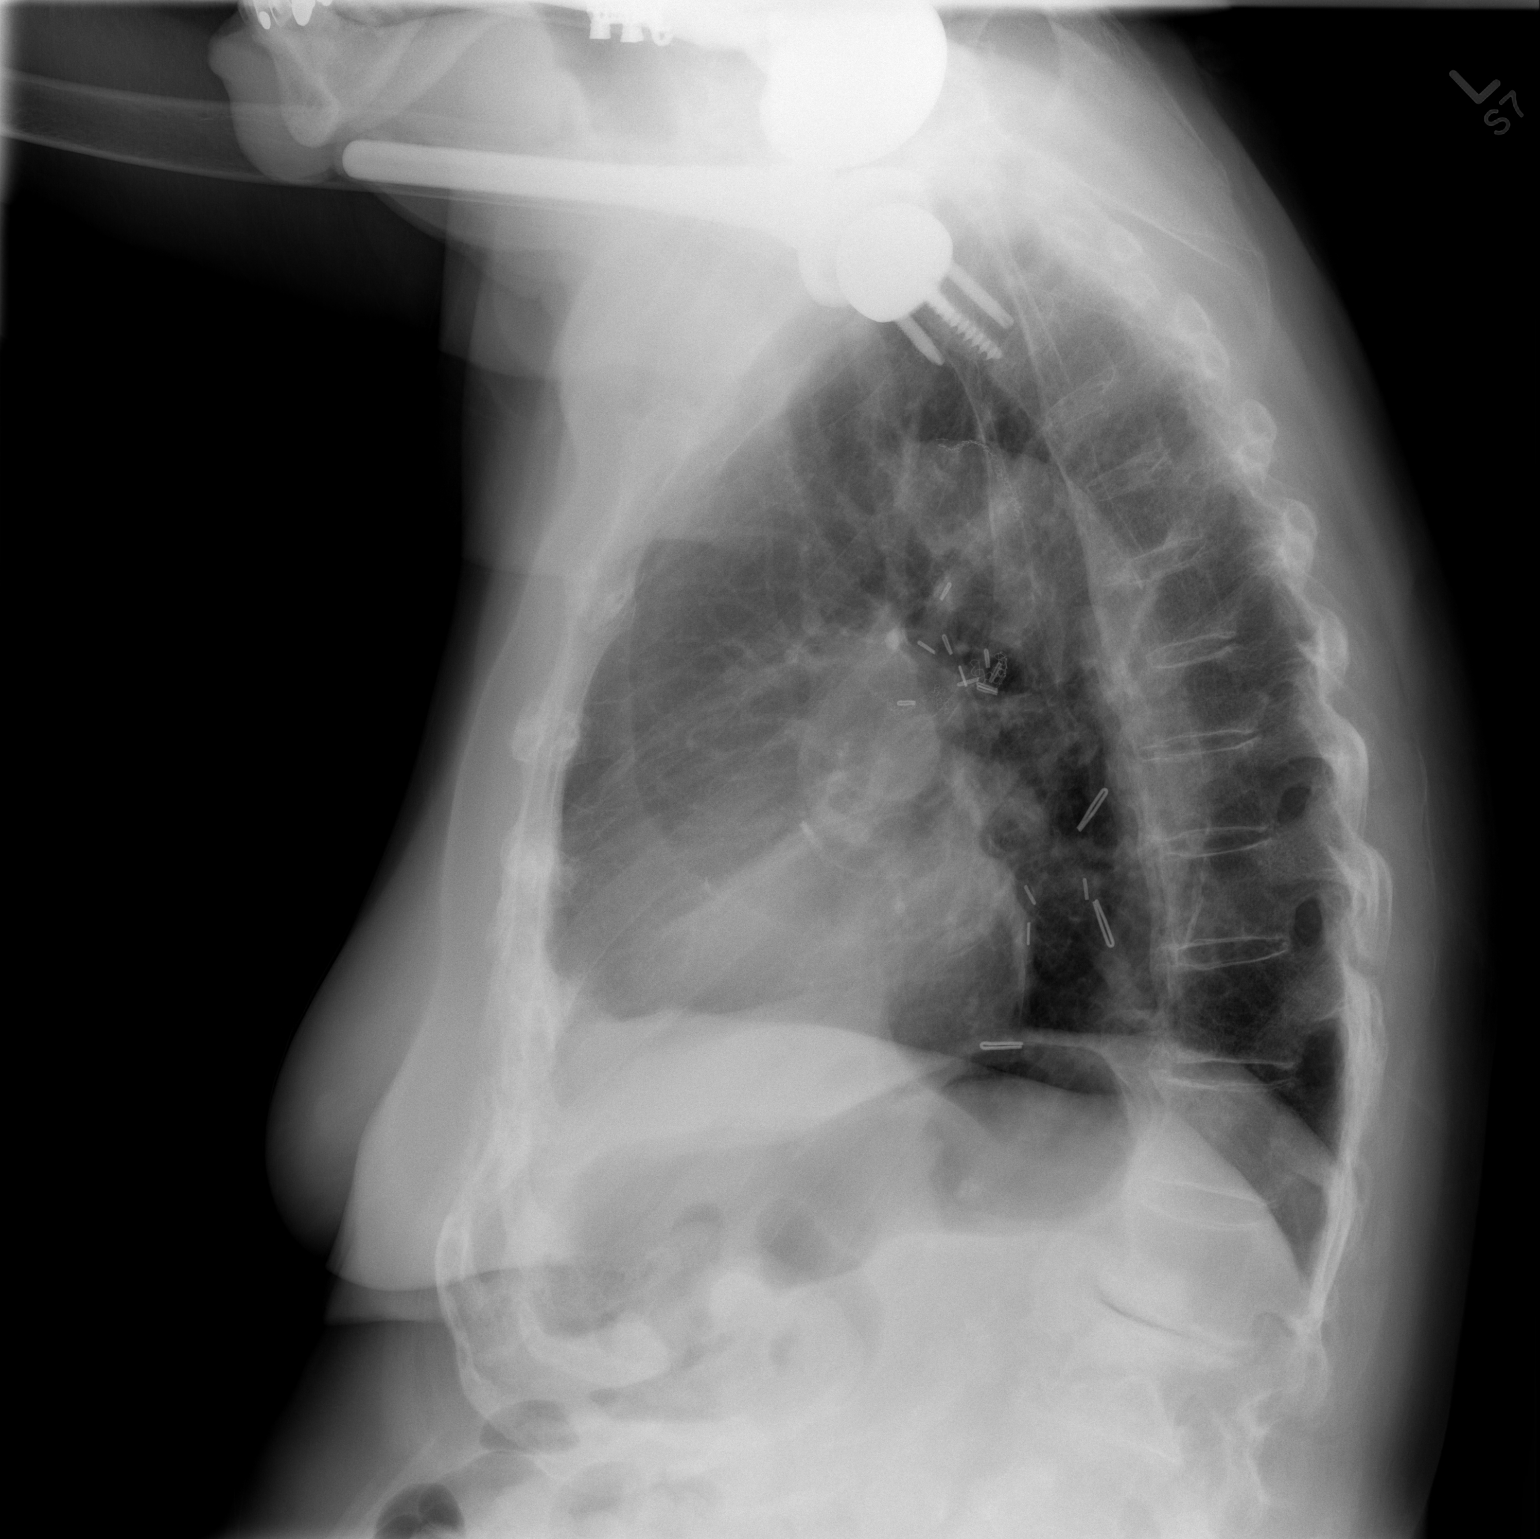

[2 of 2 positions shown; findings below may reference images not displayed]

FINDINGS: There is no focal parenchymal opacity. There is no pleural effusion
or pneumothorax. The heart and mediastinal contours are
unremarkable. There are mediastinal surgical clips.

There are bilateral shoulder arthroplasties.
IMPRESSION: No active cardiopulmonary disease.

## 2017-08-12 DIAGNOSIS — I10 Essential (primary) hypertension: Secondary | ICD-10-CM | POA: Diagnosis not present

## 2017-08-12 DIAGNOSIS — Z7984 Long term (current) use of oral hypoglycemic drugs: Secondary | ICD-10-CM | POA: Diagnosis not present

## 2017-08-12 DIAGNOSIS — E119 Type 2 diabetes mellitus without complications: Secondary | ICD-10-CM | POA: Diagnosis not present

## 2017-08-12 DIAGNOSIS — R29898 Other symptoms and signs involving the musculoskeletal system: Secondary | ICD-10-CM | POA: Diagnosis not present

## 2017-08-12 DIAGNOSIS — R2689 Other abnormalities of gait and mobility: Secondary | ICD-10-CM | POA: Diagnosis not present

## 2017-08-12 DIAGNOSIS — Z79899 Other long term (current) drug therapy: Secondary | ICD-10-CM | POA: Diagnosis not present

## 2017-08-29 DIAGNOSIS — M6281 Muscle weakness (generalized): Secondary | ICD-10-CM | POA: Diagnosis not present

## 2017-08-29 DIAGNOSIS — R2681 Unsteadiness on feet: Secondary | ICD-10-CM | POA: Diagnosis not present

## 2017-09-02 DIAGNOSIS — R2681 Unsteadiness on feet: Secondary | ICD-10-CM | POA: Diagnosis not present

## 2017-09-02 DIAGNOSIS — M6281 Muscle weakness (generalized): Secondary | ICD-10-CM | POA: Diagnosis not present

## 2017-09-04 DIAGNOSIS — M6281 Muscle weakness (generalized): Secondary | ICD-10-CM | POA: Diagnosis not present

## 2017-09-04 DIAGNOSIS — R2681 Unsteadiness on feet: Secondary | ICD-10-CM | POA: Diagnosis not present

## 2017-09-05 DIAGNOSIS — M6281 Muscle weakness (generalized): Secondary | ICD-10-CM | POA: Diagnosis not present

## 2017-09-05 DIAGNOSIS — R2681 Unsteadiness on feet: Secondary | ICD-10-CM | POA: Diagnosis not present

## 2017-09-08 DIAGNOSIS — M6281 Muscle weakness (generalized): Secondary | ICD-10-CM | POA: Diagnosis not present

## 2017-09-08 DIAGNOSIS — R2681 Unsteadiness on feet: Secondary | ICD-10-CM | POA: Diagnosis not present

## 2017-09-10 DIAGNOSIS — R2681 Unsteadiness on feet: Secondary | ICD-10-CM | POA: Diagnosis not present

## 2017-09-10 DIAGNOSIS — M6281 Muscle weakness (generalized): Secondary | ICD-10-CM | POA: Diagnosis not present

## 2017-09-11 DIAGNOSIS — R2681 Unsteadiness on feet: Secondary | ICD-10-CM | POA: Diagnosis not present

## 2017-09-11 DIAGNOSIS — M6281 Muscle weakness (generalized): Secondary | ICD-10-CM | POA: Diagnosis not present

## 2017-09-15 DIAGNOSIS — M6281 Muscle weakness (generalized): Secondary | ICD-10-CM | POA: Diagnosis not present

## 2017-09-15 DIAGNOSIS — R2681 Unsteadiness on feet: Secondary | ICD-10-CM | POA: Diagnosis not present

## 2017-09-17 DIAGNOSIS — M6281 Muscle weakness (generalized): Secondary | ICD-10-CM | POA: Diagnosis not present

## 2017-09-17 DIAGNOSIS — R2681 Unsteadiness on feet: Secondary | ICD-10-CM | POA: Diagnosis not present

## 2017-09-19 DIAGNOSIS — M6281 Muscle weakness (generalized): Secondary | ICD-10-CM | POA: Diagnosis not present

## 2017-09-19 DIAGNOSIS — R2681 Unsteadiness on feet: Secondary | ICD-10-CM | POA: Diagnosis not present

## 2017-09-22 DIAGNOSIS — M6281 Muscle weakness (generalized): Secondary | ICD-10-CM | POA: Diagnosis not present

## 2017-09-22 DIAGNOSIS — R2681 Unsteadiness on feet: Secondary | ICD-10-CM | POA: Diagnosis not present

## 2017-09-24 DIAGNOSIS — M6281 Muscle weakness (generalized): Secondary | ICD-10-CM | POA: Diagnosis not present

## 2017-09-24 DIAGNOSIS — R2681 Unsteadiness on feet: Secondary | ICD-10-CM | POA: Diagnosis not present

## 2017-09-30 DIAGNOSIS — M6281 Muscle weakness (generalized): Secondary | ICD-10-CM | POA: Diagnosis not present

## 2017-09-30 DIAGNOSIS — R2681 Unsteadiness on feet: Secondary | ICD-10-CM | POA: Diagnosis not present

## 2017-10-02 DIAGNOSIS — R2681 Unsteadiness on feet: Secondary | ICD-10-CM | POA: Diagnosis not present

## 2017-10-02 DIAGNOSIS — M6281 Muscle weakness (generalized): Secondary | ICD-10-CM | POA: Diagnosis not present

## 2017-10-07 DIAGNOSIS — M6281 Muscle weakness (generalized): Secondary | ICD-10-CM | POA: Diagnosis not present

## 2017-10-07 DIAGNOSIS — R2681 Unsteadiness on feet: Secondary | ICD-10-CM | POA: Diagnosis not present

## 2017-10-11 DIAGNOSIS — S46811A Strain of other muscles, fascia and tendons at shoulder and upper arm level, right arm, initial encounter: Secondary | ICD-10-CM | POA: Diagnosis not present

## 2017-10-11 DIAGNOSIS — W182XXA Fall in (into) shower or empty bathtub, initial encounter: Secondary | ICD-10-CM | POA: Diagnosis not present

## 2017-10-17 ENCOUNTER — Ambulatory Visit
Admission: RE | Admit: 2017-10-17 | Discharge: 2017-10-17 | Disposition: A | Discharge status: Home / Self Care | Payer: Medicare Other | Source: Ambulatory Visit | Attending: Family Medicine | Admitting: Family Medicine

## 2017-10-17 ENCOUNTER — Other Ambulatory Visit: Payer: Self-pay | Admitting: Family Medicine

## 2017-10-17 DIAGNOSIS — M25511 Pain in right shoulder: Secondary | ICD-10-CM | POA: Diagnosis not present

## 2017-10-17 DIAGNOSIS — M542 Cervicalgia: Secondary | ICD-10-CM | POA: Diagnosis not present

## 2018-01-13 DIAGNOSIS — M5489 Other dorsalgia: Secondary | ICD-10-CM | POA: Diagnosis not present

## 2018-01-13 DIAGNOSIS — I10 Essential (primary) hypertension: Secondary | ICD-10-CM | POA: Diagnosis not present

## 2018-01-13 DIAGNOSIS — E7800 Pure hypercholesterolemia, unspecified: Secondary | ICD-10-CM | POA: Diagnosis not present

## 2018-01-13 DIAGNOSIS — Z79899 Other long term (current) drug therapy: Secondary | ICD-10-CM | POA: Diagnosis not present

## 2018-01-13 DIAGNOSIS — M150 Primary generalized (osteo)arthritis: Secondary | ICD-10-CM | POA: Diagnosis not present

## 2018-01-13 DIAGNOSIS — E119 Type 2 diabetes mellitus without complications: Secondary | ICD-10-CM | POA: Diagnosis not present

## 2018-01-27 ENCOUNTER — Ambulatory Visit
Admission: RE | Admit: 2018-01-27 | Discharge: 2018-01-27 | Disposition: A | Discharge status: Home / Self Care | Payer: Medicare Other | Source: Ambulatory Visit | Attending: Family Medicine | Admitting: Family Medicine

## 2018-01-27 ENCOUNTER — Other Ambulatory Visit: Payer: Self-pay | Admitting: Family Medicine

## 2018-01-27 DIAGNOSIS — M545 Low back pain: Secondary | ICD-10-CM

## 2018-01-27 DIAGNOSIS — M549 Dorsalgia, unspecified: Secondary | ICD-10-CM | POA: Diagnosis not present

## 2018-01-30 DIAGNOSIS — M150 Primary generalized (osteo)arthritis: Secondary | ICD-10-CM | POA: Diagnosis not present

## 2018-02-08 DIAGNOSIS — R0781 Pleurodynia: Secondary | ICD-10-CM | POA: Diagnosis not present

## 2018-02-08 DIAGNOSIS — Z7984 Long term (current) use of oral hypoglycemic drugs: Secondary | ICD-10-CM | POA: Diagnosis not present

## 2018-02-08 DIAGNOSIS — E119 Type 2 diabetes mellitus without complications: Secondary | ICD-10-CM | POA: Diagnosis not present

## 2018-02-08 DIAGNOSIS — W19XXXD Unspecified fall, subsequent encounter: Secondary | ICD-10-CM | POA: Diagnosis not present

## 2018-02-08 DIAGNOSIS — E7800 Pure hypercholesterolemia, unspecified: Secondary | ICD-10-CM | POA: Diagnosis not present

## 2018-02-08 DIAGNOSIS — M150 Primary generalized (osteo)arthritis: Secondary | ICD-10-CM | POA: Diagnosis not present

## 2018-02-08 DIAGNOSIS — G8911 Acute pain due to trauma: Secondary | ICD-10-CM | POA: Diagnosis not present

## 2018-02-08 DIAGNOSIS — M545 Low back pain: Secondary | ICD-10-CM | POA: Diagnosis not present

## 2018-02-08 DIAGNOSIS — I10 Essential (primary) hypertension: Secondary | ICD-10-CM | POA: Diagnosis not present

## 2018-02-10 DIAGNOSIS — M545 Low back pain: Secondary | ICD-10-CM | POA: Diagnosis not present

## 2018-02-10 DIAGNOSIS — I10 Essential (primary) hypertension: Secondary | ICD-10-CM | POA: Diagnosis not present

## 2018-02-10 DIAGNOSIS — M150 Primary generalized (osteo)arthritis: Secondary | ICD-10-CM | POA: Diagnosis not present

## 2018-02-10 DIAGNOSIS — E119 Type 2 diabetes mellitus without complications: Secondary | ICD-10-CM | POA: Diagnosis not present

## 2018-02-10 DIAGNOSIS — R0781 Pleurodynia: Secondary | ICD-10-CM | POA: Diagnosis not present

## 2018-02-10 DIAGNOSIS — G8911 Acute pain due to trauma: Secondary | ICD-10-CM | POA: Diagnosis not present

## 2018-02-12 DIAGNOSIS — E119 Type 2 diabetes mellitus without complications: Secondary | ICD-10-CM | POA: Diagnosis not present

## 2018-02-12 DIAGNOSIS — G8911 Acute pain due to trauma: Secondary | ICD-10-CM | POA: Diagnosis not present

## 2018-02-12 DIAGNOSIS — M545 Low back pain: Secondary | ICD-10-CM | POA: Diagnosis not present

## 2018-02-12 DIAGNOSIS — I10 Essential (primary) hypertension: Secondary | ICD-10-CM | POA: Diagnosis not present

## 2018-02-12 DIAGNOSIS — R0781 Pleurodynia: Secondary | ICD-10-CM | POA: Diagnosis not present

## 2018-02-12 DIAGNOSIS — M150 Primary generalized (osteo)arthritis: Secondary | ICD-10-CM | POA: Diagnosis not present

## 2018-02-15 DIAGNOSIS — M150 Primary generalized (osteo)arthritis: Secondary | ICD-10-CM | POA: Diagnosis not present

## 2018-02-15 DIAGNOSIS — R0781 Pleurodynia: Secondary | ICD-10-CM | POA: Diagnosis not present

## 2018-02-15 DIAGNOSIS — G8911 Acute pain due to trauma: Secondary | ICD-10-CM | POA: Diagnosis not present

## 2018-02-15 DIAGNOSIS — M545 Low back pain: Secondary | ICD-10-CM | POA: Diagnosis not present

## 2018-02-15 DIAGNOSIS — E119 Type 2 diabetes mellitus without complications: Secondary | ICD-10-CM | POA: Diagnosis not present

## 2018-02-15 DIAGNOSIS — I10 Essential (primary) hypertension: Secondary | ICD-10-CM | POA: Diagnosis not present

## 2018-02-17 DIAGNOSIS — G8911 Acute pain due to trauma: Secondary | ICD-10-CM | POA: Diagnosis not present

## 2018-02-17 DIAGNOSIS — I10 Essential (primary) hypertension: Secondary | ICD-10-CM | POA: Diagnosis not present

## 2018-02-17 DIAGNOSIS — M150 Primary generalized (osteo)arthritis: Secondary | ICD-10-CM | POA: Diagnosis not present

## 2018-02-17 DIAGNOSIS — E119 Type 2 diabetes mellitus without complications: Secondary | ICD-10-CM | POA: Diagnosis not present

## 2018-02-17 DIAGNOSIS — R0781 Pleurodynia: Secondary | ICD-10-CM | POA: Diagnosis not present

## 2018-02-17 DIAGNOSIS — M545 Low back pain: Secondary | ICD-10-CM | POA: Diagnosis not present

## 2018-02-18 DIAGNOSIS — E119 Type 2 diabetes mellitus without complications: Secondary | ICD-10-CM | POA: Diagnosis not present

## 2018-02-18 DIAGNOSIS — G8911 Acute pain due to trauma: Secondary | ICD-10-CM | POA: Diagnosis not present

## 2018-02-18 DIAGNOSIS — M150 Primary generalized (osteo)arthritis: Secondary | ICD-10-CM | POA: Diagnosis not present

## 2018-02-18 DIAGNOSIS — R0781 Pleurodynia: Secondary | ICD-10-CM | POA: Diagnosis not present

## 2018-02-18 DIAGNOSIS — M545 Low back pain: Secondary | ICD-10-CM | POA: Diagnosis not present

## 2018-02-18 DIAGNOSIS — I10 Essential (primary) hypertension: Secondary | ICD-10-CM | POA: Diagnosis not present

## 2018-02-20 ENCOUNTER — Ambulatory Visit
Admission: RE | Admit: 2018-02-20 | Discharge: 2018-02-20 | Disposition: A | Discharge status: Home / Self Care | Payer: Medicare Other | Source: Ambulatory Visit | Attending: Family Medicine | Admitting: Family Medicine

## 2018-02-20 ENCOUNTER — Other Ambulatory Visit: Payer: Self-pay | Admitting: Family Medicine

## 2018-02-20 DIAGNOSIS — R0602 Shortness of breath: Secondary | ICD-10-CM

## 2018-02-20 DIAGNOSIS — M549 Dorsalgia, unspecified: Secondary | ICD-10-CM | POA: Diagnosis not present

## 2018-02-20 DIAGNOSIS — Z87891 Personal history of nicotine dependence: Secondary | ICD-10-CM | POA: Diagnosis not present

## 2018-02-20 DIAGNOSIS — R0902 Hypoxemia: Secondary | ICD-10-CM | POA: Diagnosis not present

## 2018-02-24 DIAGNOSIS — I10 Essential (primary) hypertension: Secondary | ICD-10-CM | POA: Diagnosis not present

## 2018-02-24 DIAGNOSIS — R0781 Pleurodynia: Secondary | ICD-10-CM | POA: Diagnosis not present

## 2018-02-24 DIAGNOSIS — M545 Low back pain: Secondary | ICD-10-CM | POA: Diagnosis not present

## 2018-02-24 DIAGNOSIS — E119 Type 2 diabetes mellitus without complications: Secondary | ICD-10-CM | POA: Diagnosis not present

## 2018-02-24 DIAGNOSIS — M150 Primary generalized (osteo)arthritis: Secondary | ICD-10-CM | POA: Diagnosis not present

## 2018-02-24 DIAGNOSIS — G8911 Acute pain due to trauma: Secondary | ICD-10-CM | POA: Diagnosis not present

## 2018-02-26 DIAGNOSIS — R0781 Pleurodynia: Secondary | ICD-10-CM | POA: Diagnosis not present

## 2018-02-26 DIAGNOSIS — E119 Type 2 diabetes mellitus without complications: Secondary | ICD-10-CM | POA: Diagnosis not present

## 2018-02-26 DIAGNOSIS — G8911 Acute pain due to trauma: Secondary | ICD-10-CM | POA: Diagnosis not present

## 2018-02-26 DIAGNOSIS — I10 Essential (primary) hypertension: Secondary | ICD-10-CM | POA: Diagnosis not present

## 2018-02-26 DIAGNOSIS — M545 Low back pain: Secondary | ICD-10-CM | POA: Diagnosis not present

## 2018-02-26 DIAGNOSIS — M150 Primary generalized (osteo)arthritis: Secondary | ICD-10-CM | POA: Diagnosis not present

## 2018-02-27 DIAGNOSIS — G8911 Acute pain due to trauma: Secondary | ICD-10-CM | POA: Diagnosis not present

## 2018-02-27 DIAGNOSIS — M545 Low back pain: Secondary | ICD-10-CM | POA: Diagnosis not present

## 2018-02-27 DIAGNOSIS — M150 Primary generalized (osteo)arthritis: Secondary | ICD-10-CM | POA: Diagnosis not present

## 2018-02-27 DIAGNOSIS — R0781 Pleurodynia: Secondary | ICD-10-CM | POA: Diagnosis not present

## 2018-02-27 DIAGNOSIS — I10 Essential (primary) hypertension: Secondary | ICD-10-CM | POA: Diagnosis not present

## 2018-02-27 DIAGNOSIS — E119 Type 2 diabetes mellitus without complications: Secondary | ICD-10-CM | POA: Diagnosis not present

## 2018-03-06 ENCOUNTER — Institutional Professional Consult (permissible substitution): Payer: Medicare Other | Admitting: Pulmonary Disease

## 2018-03-09 ENCOUNTER — Telehealth: Payer: Self-pay | Admitting: Pulmonary Disease

## 2018-03-09 ENCOUNTER — Encounter: Payer: Self-pay | Admitting: Pulmonary Disease

## 2018-03-09 ENCOUNTER — Ambulatory Visit (INDEPENDENT_AMBULATORY_CARE_PROVIDER_SITE_OTHER): Payer: Medicare Other | Admitting: Pulmonary Disease

## 2018-03-09 VITALS — BP 130/78 | HR 74 | Ht 62.25 in | Wt 147.0 lb

## 2018-03-09 DIAGNOSIS — R0602 Shortness of breath: Secondary | ICD-10-CM

## 2018-03-09 NOTE — Telephone Encounter (Signed)
Contacted PCP's office and requested that CBC w/diff be faxed to our office.  Will await records.

## 2018-03-09 NOTE — Patient Instructions (Signed)
We will recheck your oxygen levels today and schedule you for pulmonary function test.  Follow-up in clinic in 2 to 4 weeks after test for review and plan for next steps.

## 2018-03-09 NOTE — Progress Notes (Signed)
Brittney Watson    [id number]    November 30, 1929  Primary Care Physician:Koirala, Dibas, MD  Referring Physician: Darrow Bussing, MD [address] [place], [place] [address]  Chief complaint: Consult for dyspnea, hypoxia  HPI: 84 year old with history of diabetes, hypertension, hyperlipidemia, former smoker, lung resection [1988] She had a recent fall and is undergoing physical therapy.  Noted to have desats to mid 80s while at physical therapy.  Recovers back to 92% with rest.  Evaluated at primary care on 9/20 with chest x-ray with no acute abnormalities.  Supplemental oxygen was ordered but denied by insurance since she did not have a diagnosis of COPD, emphysema  No significant history of heart disease.  She is on Lasix for intermittent ankle swelling.  History noted for lung nodule in 1988.  She had a bronchoscopic biopsy which was nondiagnostic, complicated by lung collapse.  Subsequently underwent lung resection in [place] in 1988 and was told that the nodule was benign.  Chief complaint is dyspnea on exertion, minimal symptoms at rest.  Denies any cough, sputum production, wheezing  Pets: No pets Occupation: Used to work for an [employer].  Retired Exposures: No known exposures, no mold, hot tub, Jacuzzi. Smoking history: 30-pack-year smoking.  Quit in 1998. Travel history: Travel to [place] by car in July 2019 for a wedding.  No significant travel Originally from [place].  Lived in [place], [place]. Relevant family history: Mother had emphysema from secondhand smoke.  Outpatient Encounter Medications as of 03/09/2018  Medication Sig  . alendronate (FOSAMAX) 70 MG tablet 1 tablet once a week.  Marland Kitchen amLODipine (NORVASC) 10 MG tablet   . aspirin 81 MG chewable tablet Chew 81 mg by mouth daily.  Marland Kitchen docusate sodium (COLACE) 100 MG capsule Take 100 mg by mouth 2 (two) times daily.  . furosemide (LASIX) 40 MG tablet   . glipiZIDE (GLUCOTROL XL) 5 MG 24 hr  tablet 1 tablet daily.  Marland Kitchen lisinopril (PRINIVIL,ZESTRIL) 40 MG tablet 1 tablet daily.  . metFORMIN (GLUCOPHAGE) 500 MG tablet 2 tablets 2 (two) times daily.  . multivitamin-iron-minerals-folic acid (CENTRUM) chewable tablet Chew 1 tablet by mouth daily.   No facility-administered encounter medications on file as of 03/09/2018.     Allergies as of 03/09/2018  . (Not on File)    Past Medical History:  Diagnosis Date  . Arthritis   . Diabetes (HCC)   . Hypertension     Past Surgical History:  Procedure Laterality Date  . ABDOMINAL HYSTERECTOMY    . HIP SURGERY    . KNEE SURGERY    . LUNG SURGERY    . SHOULDER SURGERY      Family History  Problem Relation Age of Onset  . Diabetes Mother   . Hypertension Mother   . Arthritis Mother   . Emphysema Mother   . Arthritis/Rheumatoid Sister   . Hypertension Sister     Social History   Socioeconomic History  . Marital status: Widowed    Spouse name: Not on file  . Number of children: Not on file  . Years of education: Not on file  . Highest education level: Not on file  Occupational History  . Not on file  Social Needs  . Financial resource strain: Not on file  . Food insecurity:    Worry: Not on file    Inability: Not on file  . Transportation needs:    Medical: Not on file  Non-medical: Not on file  Tobacco Use  . Smoking status: Former Smoker    Packs/day: 1.00    Years: 30.00    Pack years: 30.00    Last attempt to quit: 1988    Years since quitting: 31.7  . Smokeless tobacco: Never Used  Substance and Sexual Activity  . Alcohol use: Not Currently  . Drug use: Not Currently  . Sexual activity: Not Currently  Lifestyle  . Physical activity:    Days per week: Not on file    Minutes per session: Not on file  . Stress: Not on file  Relationships  . Social connections:    Talks on phone: Not on file    Gets together: Not on file    Attends religious service: Not on file    Active member of club or  organization: Not on file    Attends meetings of clubs or organizations: Not on file    Relationship status: Not on file  . Intimate partner violence:    Fear of current or ex partner: Not on file    Emotionally abused: Not on file    Physically abused: Not on file    Forced sexual activity: Not on file  Other Topics Concern  . Not on file  Social History Narrative  . Not on file    Review of systems: Review of Systems  Constitutional: Negative for fever and chills.  HENT: Negative.   Eyes: Negative for blurred vision.  Respiratory: as per HPI  Cardiovascular: Negative for chest pain and palpitations.  Gastrointestinal: Negative for vomiting, diarrhea, blood per rectum. Genitourinary: Negative for dysuria, urgency, frequency and hematuria.  Musculoskeletal: Negative for myalgias, back pain and joint pain.  Skin: Negative for itching and rash.  Neurological: Negative for dizziness, tremors, focal weakness, seizures and loss of consciousness.  Endo/Heme/Allergies: Negative for environmental allergies.  Psychiatric/Behavioral: Negative for depression, suicidal ideas and hallucinations.  All other systems reviewed and are negative.  Physical Exam: Blood pressure 130/78, pulse 74, height 5' 2.25" (1.581 m), weight 147 lb (66.7 kg), SpO2 92 %. Gen:      No acute distress HEENT:  EOMI, sclera anicteric Neck:     No masses; no thyromegaly Lungs:    Clear to auscultation bilaterally; normal respiratory effort CV:         Regular rate and rhythm; no murmurs Abd:      + bowel sounds; soft, non-tender; no palpable masses, no distension Ext:    No edema; adequate peripheral perfusion Skin:      Warm and dry; no rash Neuro: alert and oriented x 3 Psych: normal mood and affect  Data Reviewed: Imaging: Chest x-ray 02/20/2018- surgical clips in the mediastinum, mild cardiomegaly, no active cardiopulmonary disease.   PFTs: Pending  Labs: Need to obtain recent labs including CBC from  primary care  Assessment:  Exertional hypoxia Evaluate for COPD given her smoking history. Schedule pulmonary function test O2 sats remained above 90% on exertion today.  We will continue to monitor Obtain labs from primary care  Health maintenance 03/05/2018-flu vaccine Assess for Pneumovax on return visit.  Plan/Recommendations: - Continue monitoring O2 levels - Pulmonary function test.  Chilton Greathouse MD [facility] 03/09/2018, 11:34 AM  CC: Darrow Bussing, MD

## 2018-03-16 NOTE — Telephone Encounter (Signed)
Records have been received and placed in Dr. Shirlee More look at folder for review.  Nothing further is needed.

## 2018-03-16 NOTE — Telephone Encounter (Signed)
Records have not been received as of yet. Spoke to [place] with Dr. Vincente [facility] office and requested that labs be faxed to our office.

## 2018-04-01 ENCOUNTER — Encounter: Payer: Self-pay | Admitting: Pulmonary Disease

## 2018-04-01 ENCOUNTER — Ambulatory Visit (INDEPENDENT_AMBULATORY_CARE_PROVIDER_SITE_OTHER): Payer: Medicare Other | Admitting: Pulmonary Disease

## 2018-04-01 ENCOUNTER — Telehealth: Payer: Self-pay

## 2018-04-01 VITALS — BP 124/64 | HR 81 | Ht 62.0 in | Wt 148.0 lb

## 2018-04-01 DIAGNOSIS — J449 Chronic obstructive pulmonary disease, unspecified: Secondary | ICD-10-CM

## 2018-04-01 DIAGNOSIS — R0602 Shortness of breath: Secondary | ICD-10-CM

## 2018-04-01 LAB — PULMONARY FUNCTION TEST
FEF 25-75 POST: 0.66 L/s
FEF 25-75 Pre: 0.57 L/sec
FEF2575-%Change-Post: 16 %
FEF2575-%PRED-POST: 67 %
FEF2575-%PRED-PRE: 57 %
FEV1-%Change-Post: 7 %
FEV1-%PRED-PRE: 72 %
FEV1-%Pred-Post: 77 %
FEV1-POST: 1.19 L
FEV1-PRE: 1.11 L
FEV1FVC-%Change-Post: 8 %
FEV1FVC-%PRED-PRE: 85 %
FEV6-%CHANGE-POST: -1 %
FEV6-%PRED-PRE: 91 %
FEV6-%Pred-Post: 89 %
FEV6-POST: 1.75 L
FEV6-Pre: 1.79 L
FEV6FVC-%PRED-POST: 107 %
FEV6FVC-%Pred-Pre: 107 %
FVC-%Change-Post: -1 %
FVC-%Pred-Post: 83 %
FVC-%Pred-Pre: 84 %
FVC-Post: 1.76 L
FVC-Pre: 1.79 L
PRE FEV1/FVC RATIO: 62 %
Post FEV1/FVC ratio: 67 %
Post FEV6/FVC ratio: 100 %
Pre FEV6/FVC Ratio: 100 %
RV % pred: 107 %
RV: 2.59 L
TLC % PRED: 89 %
TLC: 4.26 L

## 2018-04-01 MED ORDER — TIOTROPIUM BROMIDE-OLODATEROL 2.5-2.5 MCG/ACT IN AERS: 2.0000 | INHALATION_SPRAY | Freq: Every day | RESPIRATORY_TRACT | 0 refills | Status: DC

## 2018-04-01 NOTE — Telephone Encounter (Signed)
Kristen calling back from Dr. Rondel Baton office.  She states the patient "has not had anything".  If need to reach her, CB is [phone].

## 2018-04-01 NOTE — Telephone Encounter (Signed)
Called Belenda Cruise from Dr. Garen Lah office, unable to reach left message on nurse machine to give [place] a call back.

## 2018-04-01 NOTE — Patient Instructions (Signed)
I have reviewed your lung function test which shows moderate obstruction indicative of COPD We will start you on an inhaler called stiolto We will also schedule you for a home sleep study for evaluation of sleep apnea and low oxygen levels at night  Follow-up in 3 months.

## 2018-04-01 NOTE — Telephone Encounter (Signed)
Called patient prior PCP Tyler Deis MD, [phone], left message on nurse line, we need to know what PNE vaccines this patient has had and dates.

## 2018-04-01 NOTE — Progress Notes (Signed)
Patient attempted to complete DLCO but was unable to complete this test. Pt tried twice and asked to stop that portion of the test as she had hard time taking deep breath in to hold.  Patient was able to complete the rest of the PFT. The pre & post spiro and pleth.

## 2018-04-01 NOTE — Progress Notes (Signed)
Brittney Watson    [id number]    January 17, 1930  Primary Care Physician:Koirala, Dibas, MD  Referring Physician: Darrow Bussing, MD [address] [place], [place] [address]  Chief complaint: Follow up for COPD, hypoxia  HPI: 84 year old with history of diabetes, hypertension, hyperlipidemia, former smoker, lung resection [1988] She had a recent fall and is undergoing physical therapy.  Noted to have desats to mid 80s while at physical therapy.  Recovers back to 92% with rest.  Evaluated at primary care on 9/20 with chest x-ray with no acute abnormalities.  Supplemental oxygen was ordered but denied by insurance since she did not have a diagnosis of COPD, emphysema  No significant history of heart disease.  She is on Lasix for intermittent ankle swelling.  History noted for lung nodule in 1988.  She had a bronchoscopic biopsy which was nondiagnostic, complicated by lung collapse.  Subsequently underwent lung resection in [place] in 1988 and was told that the nodule was benign.  Chief complaint is dyspnea on exertion, minimal symptoms at rest.  Denies any cough, sputum production, wheezing  Pets: No pets Occupation: Used to work for an [employer].  Retired Exposures: No known exposures, no mold, hot tub, Jacuzzi. Smoking history: 30-pack-year smoking.  Quit in 1998. Travel history: Travel to [place] by car in July 2019 for a wedding.  No significant travel Originally from [place].  Lived in [place], [place]. Relevant family history: Mother had emphysema from secondhand smoke.  Interim history: States that dyspnea on exertion is stable with no change She is here for review of her pulmonary function test.  Outpatient Encounter Medications as of 04/01/2018  Medication Sig  . alendronate (FOSAMAX) 70 MG tablet 1 tablet once a week.  Marland Kitchen amLODipine (NORVASC) 10 MG tablet   . aspirin 81 MG chewable tablet Chew 81 mg by mouth daily.  Marland Kitchen docusate sodium (COLACE)  100 MG capsule Take 100 mg by mouth 2 (two) times daily.  . furosemide (LASIX) 40 MG tablet   . glipiZIDE (GLUCOTROL XL) 5 MG 24 hr tablet 1 tablet daily.  Marland Kitchen lisinopril (PRINIVIL,ZESTRIL) 40 MG tablet 1 tablet daily.  . metFORMIN (GLUCOPHAGE) 500 MG tablet 2 tablets 2 (two) times daily.  . multivitamin-iron-minerals-folic acid (CENTRUM) chewable tablet Chew 1 tablet by mouth daily.   No facility-administered encounter medications on file as of 04/01/2018.    Physical Exam: Blood pressure 124/64, pulse 81, height 5\' 2"  (1.575 m), weight 148 lb (67.1 kg), SpO2 94 %. Gen:      No acute distress HEENT:  EOMI, sclera anicteric Neck:     No masses; no thyromegaly Lungs:    Clear to auscultation bilaterally; normal respiratory effort CV:         Regular rate and rhythm; no murmurs Abd:      + bowel sounds; soft, non-tender; no palpable masses, no distension Ext:    No edema; adequate peripheral perfusion Skin:      Warm and dry; no rash Neuro: alert and oriented x 3 Psych: normal mood and affect  Data Reviewed: Imaging: Chest x-ray 02/20/2018- surgical clips in the mediastinum, mild cardiomegaly, no active cardiopulmonary disease.  I have reviewed the images personally.  PFTs: 04/01/2018 FVC 1.76 [83%), FEV1 1.19 [77%), F/F 67, TLC 89% Moderate obstruction with no bronchodilator response.  Unable to do DLCO  Labs: CBC from primary care 03/16/2018 WBC 7.1, eos 1.7%, absolute eosinophil count 121.  Assessment:  COPD PFTs reviewed  with moderate obstruction We will start her on Stiolto inhaler.  Labs from primary care shows low peripheral eosinophilia so she does not need inhaled corticosteroids. O2 levels went down transiently to 88% on exertion today. She is not at the point where will need supplemental oxygen as recent studies do not show any benefit for transient desaturations.   Evaluate for sleep apnea and nocturnal hypoxia with home sleep study.  Health  maintenance 03/05/2018-flu vaccine Got Pneumovax at  her primary care 5 years ago.   Plan/Recommendations: - Continue monitoring O2 levels - Sart stiolto - Home sleep study.  Chilton Greathouse MD [facility] 04/01/2018, 11:37 AM  CC: Darrow Bussing, MD

## 2018-04-14 ENCOUNTER — Telehealth: Payer: Self-pay | Admitting: Pulmonary Disease

## 2018-04-14 MED ORDER — TIOTROPIUM BROMIDE-OLODATEROL 2.5-2.5 MCG/ACT IN AERS: 2.0000 | INHALATION_SPRAY | Freq: Every day | RESPIRATORY_TRACT | 5 refills | Status: DC

## 2018-04-14 NOTE — Telephone Encounter (Signed)
Called and spoke with Patient.  Patient stated that she is doing much better with Stiolto. Patient stated that she has finished her sample and would like a new refill sent to [facility] mail order [facility].  Prescription sent.  Nothing further at this time.  Per OV 04/01/18-  We will start you on an inhaler called stiolto

## 2018-04-21 DIAGNOSIS — R2689 Other abnormalities of gait and mobility: Secondary | ICD-10-CM | POA: Diagnosis not present

## 2018-04-21 DIAGNOSIS — E7800 Pure hypercholesterolemia, unspecified: Secondary | ICD-10-CM | POA: Diagnosis not present

## 2018-04-21 DIAGNOSIS — Z7984 Long term (current) use of oral hypoglycemic drugs: Secondary | ICD-10-CM | POA: Diagnosis not present

## 2018-04-21 DIAGNOSIS — Z0001 Encounter for general adult medical examination with abnormal findings: Secondary | ICD-10-CM | POA: Diagnosis not present

## 2018-04-21 DIAGNOSIS — G4733 Obstructive sleep apnea (adult) (pediatric): Secondary | ICD-10-CM | POA: Diagnosis not present

## 2018-04-21 DIAGNOSIS — M150 Primary generalized (osteo)arthritis: Secondary | ICD-10-CM | POA: Diagnosis not present

## 2018-04-21 DIAGNOSIS — M810 Age-related osteoporosis without current pathological fracture: Secondary | ICD-10-CM | POA: Diagnosis not present

## 2018-04-21 DIAGNOSIS — Z79899 Other long term (current) drug therapy: Secondary | ICD-10-CM | POA: Diagnosis not present

## 2018-04-21 DIAGNOSIS — I10 Essential (primary) hypertension: Secondary | ICD-10-CM | POA: Diagnosis not present

## 2018-04-21 DIAGNOSIS — J449 Chronic obstructive pulmonary disease, unspecified: Secondary | ICD-10-CM | POA: Diagnosis not present

## 2018-04-21 DIAGNOSIS — F320 Major depressive disorder, single episode, mild: Secondary | ICD-10-CM | POA: Diagnosis not present

## 2018-04-21 DIAGNOSIS — E119 Type 2 diabetes mellitus without complications: Secondary | ICD-10-CM | POA: Diagnosis not present

## 2018-04-23 ENCOUNTER — Telehealth: Payer: Self-pay | Admitting: Pulmonary Disease

## 2018-04-23 NOTE — Telephone Encounter (Signed)
Try bevespi 

## 2018-04-23 NOTE — Telephone Encounter (Signed)
ATC x3- call was disconnected each time.  Will call back

## 2018-04-23 NOTE — Telephone Encounter (Signed)
Pt brought by denial letter for [employer]. Also received denial letter from [facility], covered alternatives are Anoro or Bevespi.  Dr. Isaiah SergeMannam please advise. Thanks

## 2018-04-24 DIAGNOSIS — G4733 Obstructive sleep apnea (adult) (pediatric): Secondary | ICD-10-CM | POA: Diagnosis not present

## 2018-04-28 MED ORDER — GLYCOPYRROLATE-FORMOTEROL 9-4.8 MCG/ACT IN AERO: 2.0000 | INHALATION_SPRAY | Freq: Two times a day (BID) | RESPIRATORY_TRACT | 3 refills | Status: DC

## 2018-04-28 NOTE — Telephone Encounter (Signed)
Pt returned call. I stated to her due to the Stiolto not being covered by insurance, Dr. Isaiah SergeMannam [facility] us to [facility]. Pt expressed understanding and asked if this Rx could be sent to her mail order pharmacy.  I verified with pt that we had the correct mail order pharmacy and sent Rx in. Nothing further needed.

## 2018-05-06 ENCOUNTER — Telehealth: Payer: Self-pay | Admitting: Pulmonary Disease

## 2018-05-06 DIAGNOSIS — G4733 Obstructive sleep apnea (adult) (pediatric): Secondary | ICD-10-CM

## 2018-05-06 NOTE — Telephone Encounter (Addendum)
Dr. Wynona Neatlalere has reviewed the home sleep test this showed Severe Obstructive Sleep apnea.   Recommendations   Treatment options are CPAP with the settings auto 5 to 15.    Weight loss measures .   Advise against driving while sleepy & against medication with sedative side effects.    Make appointment for 3 months for compliance with download with Dr. Isaiah SergeMANNAM.   ATC left message

## 2018-05-13 ENCOUNTER — Other Ambulatory Visit: Payer: Self-pay | Admitting: Family Medicine

## 2018-05-13 ENCOUNTER — Other Ambulatory Visit: Payer: Self-pay | Admitting: *Deleted

## 2018-05-13 DIAGNOSIS — M810 Age-related osteoporosis without current pathological fracture: Secondary | ICD-10-CM

## 2018-05-13 DIAGNOSIS — R0602 Shortness of breath: Secondary | ICD-10-CM

## 2018-05-13 NOTE — Telephone Encounter (Signed)
Called patient unable to reach left message to give us a call back.

## 2018-05-13 NOTE — Telephone Encounter (Signed)
Patient returned call, CB is [phone].  States her phone is not ringing but she will try to watch for the call.

## 2018-05-13 NOTE — Telephone Encounter (Signed)
ATC patient left message will call again

## 2018-05-14 NOTE — Telephone Encounter (Signed)
Patient calling back, [phone]

## 2018-05-14 NOTE — Telephone Encounter (Signed)
Advised pt of results. Pt understood and nothing further is needed.   CPAP ordered.  

## 2018-06-23 ENCOUNTER — Ambulatory Visit: Payer: Medicare Other | Admitting: Pulmonary Disease

## 2018-06-26 ENCOUNTER — Ambulatory Visit: Payer: Medicare Other | Admitting: Pulmonary Disease

## 2018-07-23 ENCOUNTER — Other Ambulatory Visit: Payer: Medicare Other

## 2018-07-23 ENCOUNTER — Ambulatory Visit (INDEPENDENT_AMBULATORY_CARE_PROVIDER_SITE_OTHER): Payer: Medicare Other | Admitting: Pulmonary Disease

## 2018-07-23 ENCOUNTER — Encounter: Payer: Self-pay | Admitting: Pulmonary Disease

## 2018-07-23 VITALS — BP 130/70 | HR 84 | Ht 62.0 in | Wt 151.2 lb

## 2018-07-23 DIAGNOSIS — J449 Chronic obstructive pulmonary disease, unspecified: Secondary | ICD-10-CM

## 2018-07-23 DIAGNOSIS — G4733 Obstructive sleep apnea (adult) (pediatric): Secondary | ICD-10-CM

## 2018-07-23 NOTE — Patient Instructions (Signed)
I am glad you are doing well with your CPAP We will hold off on inhalers as your breathing is stable Follow-up in 1 year.

## 2018-07-23 NOTE — Progress Notes (Signed)
Brittney Watson    [id number]    1930/02/14  Primary Care Physician:Koirala, Dibas, MD  Referring Physician: Darrow Bussing, MD [address] [place], [place] [address]  Chief complaint: Follow up for COPD, OSA  HPI: 89 follow-up for severe persistent asthma.  Follow-up for severe persistent asthma.  Intermittently: -year-old with history of diabetes, hypertension, hyperlipidemia, former smoker, lung resection [1988] She had a recent fall and is undergoing physical therapy.  Noted to have desats to mid 80s while at physical therapy.  Recovers back to 92% with rest.  Evaluated at primary care on 9/20 with chest x-ray with no acute abnormalities.  Supplemental oxygen was ordered but denied by insurance since she did not have a diagnosis of COPD, emphysema  No significant history of heart disease.  She is on Lasix for intermittent ankle swelling.  History noted for lung nodule in 1988.  She had a bronchoscopic biopsy which was nondiagnostic, complicated by lung collapse.  Subsequently underwent lung resection in [place] in 1988 and was told that the nodule was benign.  Chief complaint is dyspnea on exertion, minimal symptoms at rest.  Denies any cough, sputum production, wheezing  Pets: No pets Occupation: Used to work for an [employer].  Retired Exposures: No known exposures, no mold, hot tub, Jacuzzi. Smoking history: 30-pack-year smoking.  Quit in 1998. Travel history: Travel to [place] by car in July 2019 for a wedding.  No significant travel Originally from [place].  Lived in [place], [place]. Relevant family history: Mother had emphysema from secondhand smoke.  Interim history: States that breathing is stable.  She had tried Bevespi but could not afford it as the co-pay was about 300 Dyspnea on exertion, no cough, sputum production, wheezing  Started on CPAP after home sleep study showed severe sleep apnea.  She is doing well on P and reports  improved sleep during night.  Outpatient Encounter Medications as of 07/23/2018  Medication Sig  . alendronate (FOSAMAX) 70 MG tablet 1 tablet once a week.  Marland Kitchen amLODipine (NORVASC) 10 MG tablet   . aspirin 81 MG chewable tablet Chew 81 mg by mouth daily.  Marland Kitchen docusate sodium (COLACE) 100 MG capsule Take 100 mg by mouth 2 (two) times daily.  . furosemide (LASIX) 40 MG tablet   . glipiZIDE (GLUCOTROL XL) 5 MG 24 hr tablet 1 tablet daily.  Marland Kitchen lisinopril (PRINIVIL,ZESTRIL) 40 MG tablet 1 tablet daily.  . metFORMIN (GLUCOPHAGE) 500 MG tablet 2 tablets 2 (two) times daily.  . multivitamin-iron-minerals-folic acid (CENTRUM) chewable tablet Chew 1 tablet by mouth daily.  . [DISCONTINUED] Glycopyrrolate-Formoterol (BEVESPI AEROSPHERE) 9-4.8 MCG/ACT AERO Inhale 2 puffs into the lungs 2 (two) times daily. (Patient not taking: Reported on 07/23/2018)   No facility-administered encounter medications on file as of 07/23/2018.    Physical Exam: Blood pressure 130/70, pulse 84, height 5\' 2"  (1.575 m), weight 151 lb 3.2 oz (68.6 kg), SpO2 92 %. Gen:      No acute distress HEENT:  EOMI, sclera anicteric Neck:     No masses; no thyromegaly Lungs:    Clear to auscultation bilaterally; normal respiratory effort CV:         Regular rate and rhythm; no murmurs Abd:      + bowel sounds; soft, non-tender; no palpable masses, no distension Ext:    No edema; adequate peripheral perfusion Skin:      Warm and dry; no rash Neuro: alert and oriented x 3  Psych: normal mood and affect  Data Reviewed: Imaging: Chest x-ray 02/20/2018- surgical clips in the mediastinum, mild cardiomegaly, no active cardiopulmonary disease.  I have reviewed the images personally.  PFTs: 04/01/2018 FVC 1.76 [83%), FEV1 1.19 [77%), F/F 67, TLC 89% Moderate obstruction with no bronchodilator response.  Unable to do DLCO  Labs: CBC from primary care 03/16/2018 WBC 7.1, eos 1.7%, absolute eosinophil count 121.  Sleep Home PSG  04/22/2018- Severe sleep apnea, AHI 35.6 with 40.7 with desats to 76%.  Assessment:  COPD PFTs reviewed with moderate obstruction We have tried [employer] and Breo but either were not covered by insurance or she could not afford the inhaler Since her breathing is stable will observe her off any medication She did not desat on exertion  Severe sleep apnea with nocturnal hypoxemia Stable on CPAP AutoSet 5-15 with good compliance and response We will order overnight oximetry on CPAP to evaluate for any ongoing desats at night.  Health maintenance 03/05/2018-flu vaccine Got Pneumovax at  her primary care 6 years ago.   Plan/Recommendations: - Overnight oximetry - Continue CPAP  Chilton Greathouse MD [facility] 07/23/2018, 12:03 PM  CC: Darrow Bussing, MD

## 2018-07-30 ENCOUNTER — Ambulatory Visit
Admission: RE | Admit: 2018-07-30 | Discharge: 2018-07-30 | Disposition: A | Discharge status: Home / Self Care | Payer: Medicare Other | Source: Ambulatory Visit | Attending: Family Medicine | Admitting: Family Medicine

## 2018-07-30 DIAGNOSIS — M810 Age-related osteoporosis without current pathological fracture: Secondary | ICD-10-CM

## 2018-07-30 DIAGNOSIS — Z780 Asymptomatic menopausal state: Secondary | ICD-10-CM | POA: Diagnosis not present

## 2018-08-17 DIAGNOSIS — J449 Chronic obstructive pulmonary disease, unspecified: Secondary | ICD-10-CM | POA: Diagnosis not present

## 2018-08-17 DIAGNOSIS — R0902 Hypoxemia: Secondary | ICD-10-CM | POA: Diagnosis not present

## 2018-10-06 DIAGNOSIS — I10 Essential (primary) hypertension: Secondary | ICD-10-CM | POA: Diagnosis not present

## 2018-10-06 DIAGNOSIS — Z7984 Long term (current) use of oral hypoglycemic drugs: Secondary | ICD-10-CM | POA: Diagnosis not present

## 2018-10-06 DIAGNOSIS — E119 Type 2 diabetes mellitus without complications: Secondary | ICD-10-CM | POA: Diagnosis not present

## 2018-10-06 DIAGNOSIS — E7800 Pure hypercholesterolemia, unspecified: Secondary | ICD-10-CM | POA: Diagnosis not present

## 2019-01-18 DIAGNOSIS — F321 Major depressive disorder, single episode, moderate: Secondary | ICD-10-CM | POA: Diagnosis not present

## 2019-01-18 DIAGNOSIS — I10 Essential (primary) hypertension: Secondary | ICD-10-CM | POA: Diagnosis not present

## 2019-01-18 DIAGNOSIS — M810 Age-related osteoporosis without current pathological fracture: Secondary | ICD-10-CM | POA: Diagnosis not present

## 2019-01-18 DIAGNOSIS — E119 Type 2 diabetes mellitus without complications: Secondary | ICD-10-CM | POA: Diagnosis not present

## 2019-01-18 DIAGNOSIS — Z7984 Long term (current) use of oral hypoglycemic drugs: Secondary | ICD-10-CM | POA: Diagnosis not present

## 2019-01-18 DIAGNOSIS — Z79899 Other long term (current) drug therapy: Secondary | ICD-10-CM | POA: Diagnosis not present

## 2019-02-23 DIAGNOSIS — E119 Type 2 diabetes mellitus without complications: Secondary | ICD-10-CM | POA: Diagnosis not present

## 2019-02-23 DIAGNOSIS — Z7984 Long term (current) use of oral hypoglycemic drugs: Secondary | ICD-10-CM | POA: Diagnosis not present

## 2019-03-02 DIAGNOSIS — Z23 Encounter for immunization: Secondary | ICD-10-CM | POA: Diagnosis not present

## 2019-04-01 IMAGING — DX DG CHEST 2V
2 series · 2 of 2 positions shown · non-contrast
Comparison: 04/09/2016

CLINICAL DATA: Shortness of breath

EXAM:
CHEST - 2 VIEW

[dg chest 2 view (1 of 2)]
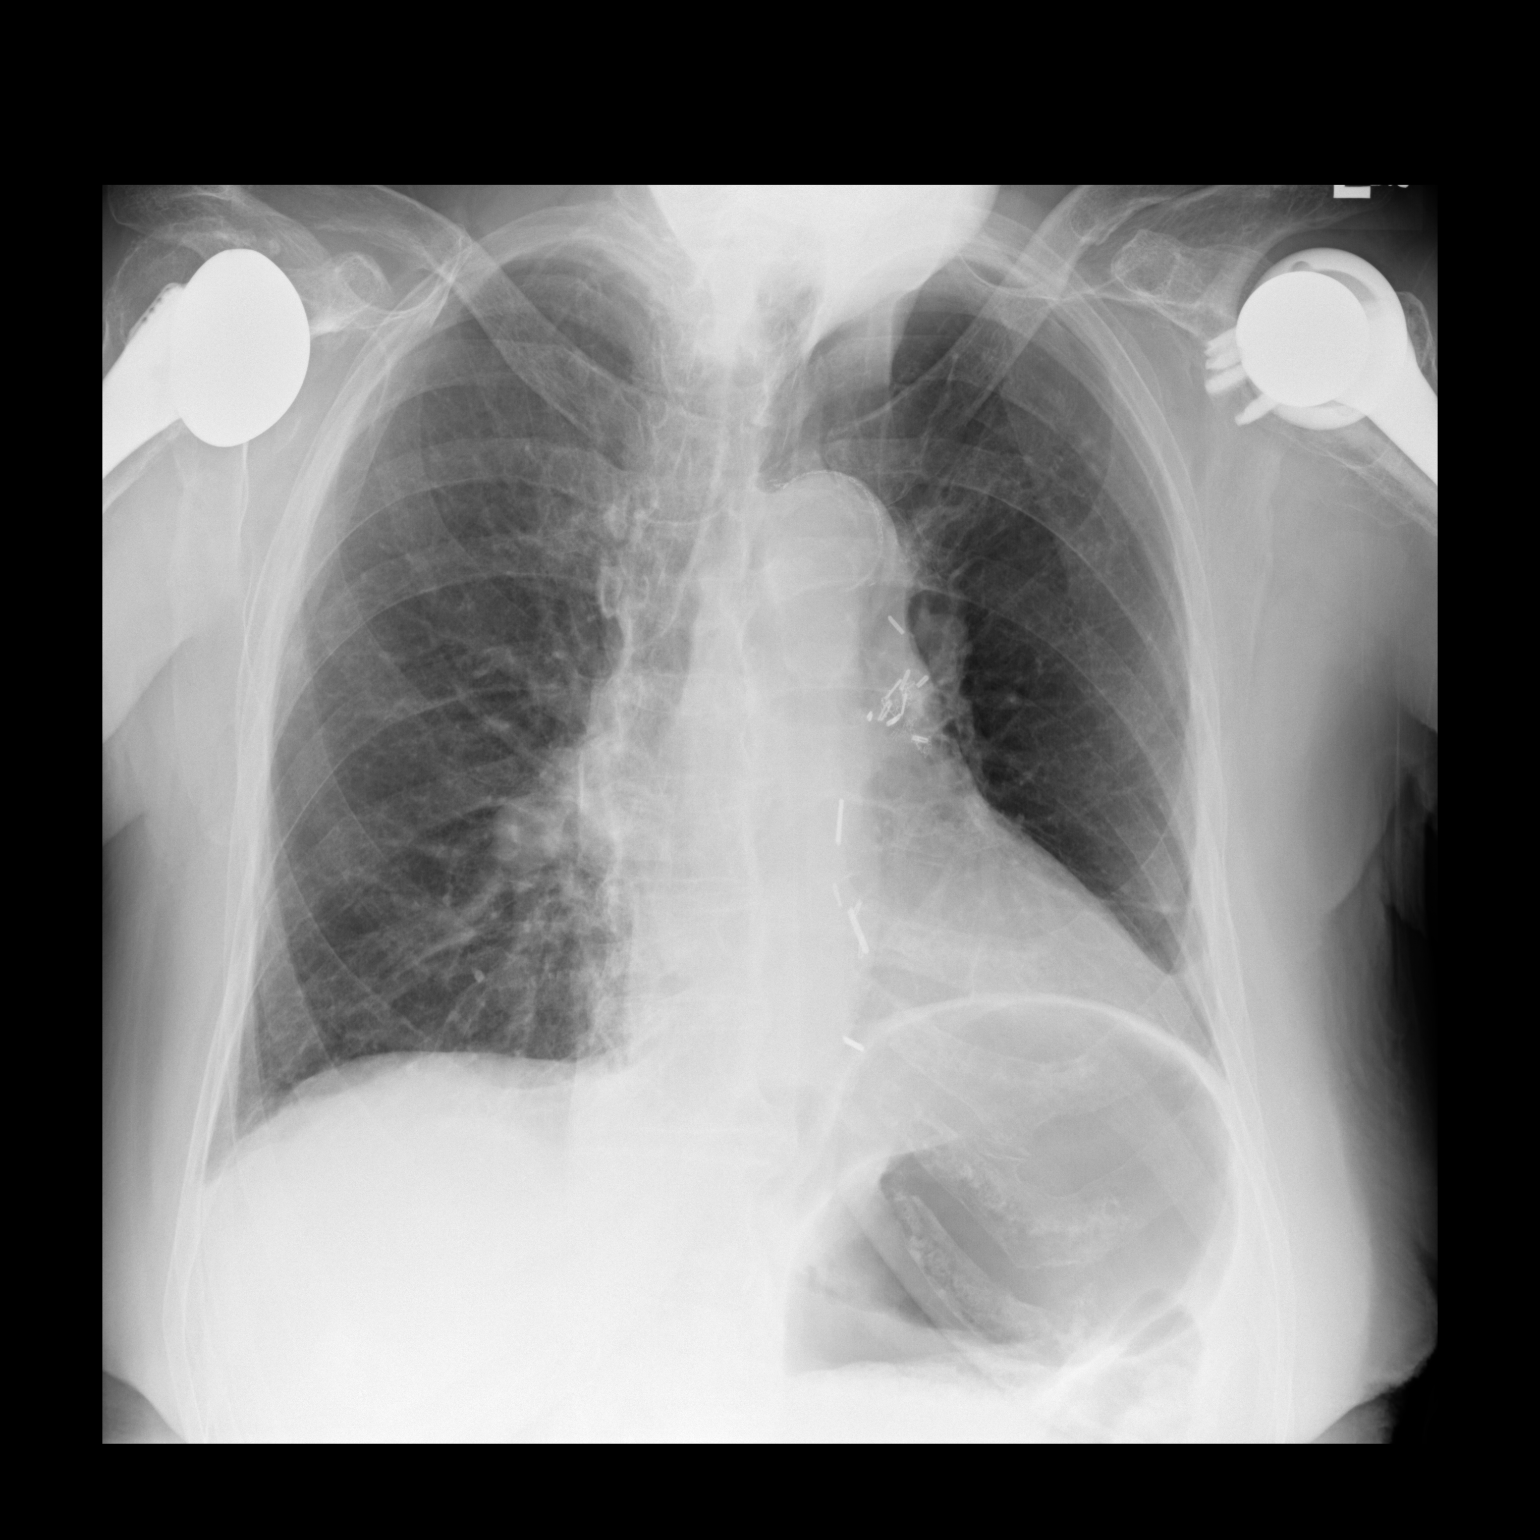

[dg chest 2 view (2 of 2)]
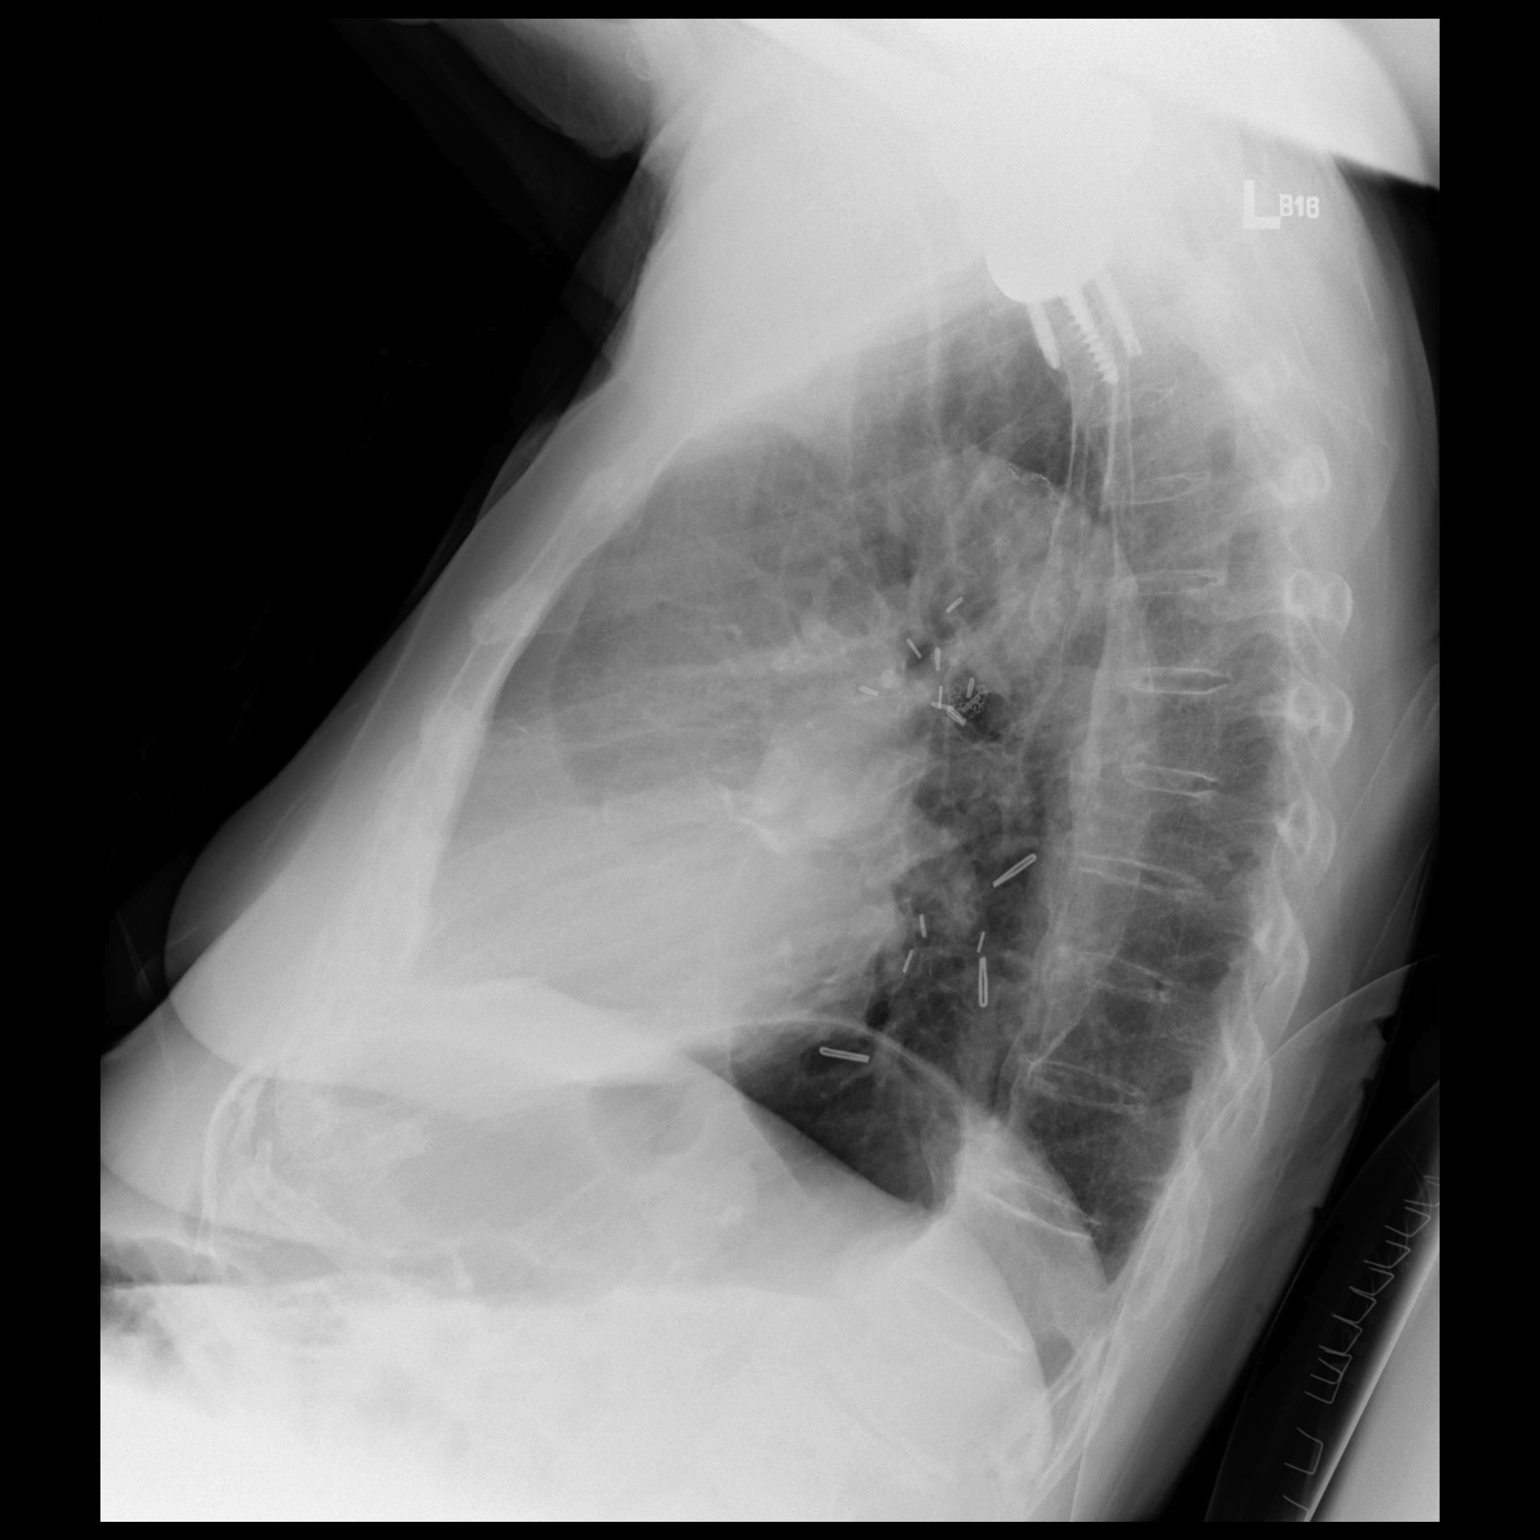

[2 of 2 positions shown; findings below may reference images not displayed]

FINDINGS: Bilateral shoulder replacements. No focal opacity or pleural
effusion. Mild cardiomegaly. Mediastinal surgical clips. Suture over
the aortic arch. No pneumothorax.
IMPRESSION: No active cardiopulmonary disease. Mild cardiomegaly. Postsurgical
changes of the mediastinum.

## 2019-07-08 DIAGNOSIS — Z23 Encounter for immunization: Secondary | ICD-10-CM | POA: Diagnosis not present

## 2019-08-03 DIAGNOSIS — Z0001 Encounter for general adult medical examination with abnormal findings: Secondary | ICD-10-CM | POA: Diagnosis not present

## 2019-08-03 DIAGNOSIS — F321 Major depressive disorder, single episode, moderate: Secondary | ICD-10-CM | POA: Diagnosis not present

## 2019-08-03 DIAGNOSIS — J449 Chronic obstructive pulmonary disease, unspecified: Secondary | ICD-10-CM | POA: Diagnosis not present

## 2019-08-03 DIAGNOSIS — I10 Essential (primary) hypertension: Secondary | ICD-10-CM | POA: Diagnosis not present

## 2019-08-03 DIAGNOSIS — E1169 Type 2 diabetes mellitus with other specified complication: Secondary | ICD-10-CM | POA: Diagnosis not present

## 2019-08-03 DIAGNOSIS — Z79899 Other long term (current) drug therapy: Secondary | ICD-10-CM | POA: Diagnosis not present

## 2019-08-03 DIAGNOSIS — E7800 Pure hypercholesterolemia, unspecified: Secondary | ICD-10-CM | POA: Diagnosis not present

## 2019-08-03 DIAGNOSIS — M810 Age-related osteoporosis without current pathological fracture: Secondary | ICD-10-CM | POA: Diagnosis not present

## 2019-08-05 DIAGNOSIS — Z23 Encounter for immunization: Secondary | ICD-10-CM | POA: Diagnosis not present

## 2019-08-06 DIAGNOSIS — E1169 Type 2 diabetes mellitus with other specified complication: Secondary | ICD-10-CM | POA: Diagnosis not present

## 2019-08-06 DIAGNOSIS — Z79899 Other long term (current) drug therapy: Secondary | ICD-10-CM | POA: Diagnosis not present

## 2019-08-06 DIAGNOSIS — E7800 Pure hypercholesterolemia, unspecified: Secondary | ICD-10-CM | POA: Diagnosis not present

## 2019-09-09 DIAGNOSIS — H16223 Keratoconjunctivitis sicca, not specified as Sjogren's, bilateral: Secondary | ICD-10-CM | POA: Diagnosis not present

## 2019-09-09 DIAGNOSIS — H43812 Vitreous degeneration, left eye: Secondary | ICD-10-CM | POA: Diagnosis not present

## 2019-09-09 DIAGNOSIS — H26492 Other secondary cataract, left eye: Secondary | ICD-10-CM | POA: Diagnosis not present

## 2019-09-29 DIAGNOSIS — J449 Chronic obstructive pulmonary disease, unspecified: Secondary | ICD-10-CM | POA: Diagnosis not present

## 2019-09-29 DIAGNOSIS — R0902 Hypoxemia: Secondary | ICD-10-CM | POA: Diagnosis not present

## 2019-10-05 ENCOUNTER — Ambulatory Visit: Payer: Medicare Other | Admitting: Pulmonary Disease

## 2019-10-11 ENCOUNTER — Encounter: Payer: Self-pay | Admitting: Pulmonary Disease

## 2019-10-11 ENCOUNTER — Other Ambulatory Visit: Payer: Self-pay

## 2019-10-11 ENCOUNTER — Ambulatory Visit (INDEPENDENT_AMBULATORY_CARE_PROVIDER_SITE_OTHER): Payer: Medicare Other | Admitting: Pulmonary Disease

## 2019-10-11 VITALS — BP 130/76 | HR 72 | Temp 97.6°F | Ht 64.5 in | Wt 152.0 lb

## 2019-10-11 DIAGNOSIS — J449 Chronic obstructive pulmonary disease, unspecified: Secondary | ICD-10-CM | POA: Insufficient documentation

## 2019-10-11 DIAGNOSIS — G4733 Obstructive sleep apnea (adult) (pediatric): Secondary | ICD-10-CM | POA: Diagnosis not present

## 2019-10-11 NOTE — Patient Instructions (Signed)
Schedule overnight oximetry on CPAP Encourage compliance with sleep apnea therapy Follow-up in 1 year.

## 2019-10-11 NOTE — Progress Notes (Signed)
Brittney Watson    [id number]    10-15-1929  Primary Care Physician:Koirala, Dibas, MD  Referring Physician: Darrow Bussing, MD [address] [place],  [place] [address]  Chief complaint: Follow up for COPD, OSA  HPI: 89 follow-up for severe persistent asthma.  Follow-up for severe persistent asthma.  Intermittently: -year-old with history of diabetes, hypertension, hyperlipidemia, former smoker, lung resection [1988] She had a recent fall and is undergoing physical therapy.  Noted to have desats to mid 80s while at physical therapy.  Recovers back to 92% with rest.  Evaluated at primary care on 9/20 with chest x-ray with no acute abnormalities.  Supplemental oxygen was ordered but denied by insurance since she did not have a diagnosis of COPD, emphysema  No significant history of heart disease.  She is on Lasix for intermittent ankle swelling.  History noted for lung nodule in 1988.  She had a bronchoscopic biopsy which was nondiagnostic, complicated by lung collapse.  Subsequently underwent lung resection in [place] in 1988 and was told that the nodule was benign.  Chief complaint is dyspnea on exertion, minimal symptoms at rest.  Denies any cough, sputum production, wheezing  Pets: No pets Occupation: Used to work for an [employer].  Retired Exposures: No known exposures, no mold, hot tub, Jacuzzi. Smoking history: 30-pack-year smoking.  Quit in 1998. Travel history: Travel to [place] by car in July 2019 for a wedding.  No significant travel Originally from [place].  Lived in [place], [place]. Relevant family history: Mother had emphysema from secondhand smoke.  Interim history: States that breathing is stable.  She had tried Bevespi but could not afford it as the co-pay was about 300 Dyspnea on exertion, no cough, sputum production, wheezing  Stable on AutoSet CPAP. We ordered an overnight oximetry last year but unable to be completed due to  COVID-19 pandemic  Outpatient Encounter Medications as of 10/11/2019  Medication Sig  . alendronate (FOSAMAX) 70 MG tablet 1 tablet once a week.  Marland Kitchen amLODipine (NORVASC) 10 MG tablet   . aspirin 81 MG chewable tablet Chew 81 mg by mouth daily.  Marland Kitchen docusate sodium (COLACE) 100 MG capsule Take 100 mg by mouth 2 (two) times daily.  . furosemide (LASIX) 40 MG tablet   . glipiZIDE (GLUCOTROL XL) 5 MG 24 hr tablet 1 tablet daily.  Marland Kitchen lisinopril (PRINIVIL,ZESTRIL) 40 MG tablet 1 tablet daily.  . metFORMIN (GLUCOPHAGE) 500 MG tablet 2 tablets 2 (two) times daily.  . multivitamin-iron-minerals-folic acid (CENTRUM) chewable tablet Chew 1 tablet by mouth daily.   No facility-administered encounter medications on file as of 10/11/2019.   Physical Exam: Blood pressure 130/70, pulse 84, height 5\' 2"  (1.575 m), weight 151 lb 3.2 oz (68.6 kg), SpO2 92 %. Gen:      No acute distress HEENT:  EOMI, sclera anicteric Neck:     No masses; no thyromegaly Lungs:    Clear to auscultation bilaterally; normal respiratory effort CV:         Regular rate and rhythm; no murmurs Abd:      + bowel sounds; soft, non-tender; no palpable masses, no distension Ext:    No edema; adequate peripheral perfusion Skin:      Warm and dry; no rash Neuro: alert and oriented x 3 Psych: normal mood and affect  Data Reviewed: Imaging: Chest x-ray 02/20/2018- surgical clips in the mediastinum, mild cardiomegaly, no active cardiopulmonary disease.  I have reviewed the images  personally.  PFTs: 04/01/2018 FVC 1.76 [83%), FEV1 1.19 [77%), F/F 67, TLC 89% Moderate obstruction with no bronchodilator response.  Unable to do DLCO  Labs: CBC from primary care 03/16/2018 WBC 7.1, eos 1.7%, absolute eosinophil count 121.  Sleep Home PSG 04/22/2018- Severe sleep apnea, AHI 35.6 with 40.7 with desats to 76%.  CPAP download 10/06/2019 33% usage greater than 4 hours Residual AHI 1.1  Assessment:  COPD PFTs reviewed with moderate  obstruction We have tried [employer] and Breo but either were not covered by insurance or she could not afford the inhaler Since her breathing is stable will observe her off any medication She did not desat on exertion  Severe sleep apnea with nocturnal hypoxemia Stable on CPAP AutoSet 5-15.  She is not using the CPAP every day due to nasal congestion Encouraged her to be more compliant. Order overnight oximetry on CPAP to evaluate for any ongoing desats at night.  Health maintenance 03/05/2018-flu vaccine Got Pneumovax at  her primary care 7 years ago.   Plan/Recommendations: - Overnight oximetry - Continue CPAP  Marshell Garfinkel MD [facility] 10/11/2019, 11:09 AM  CC: Lujean Amel, MD

## 2019-10-19 ENCOUNTER — Telehealth: Payer: Self-pay | Admitting: Pulmonary Disease

## 2019-10-19 NOTE — Telephone Encounter (Signed)
Spoke with pt. She is to have an ONO done and wants to know if she needs to wear her CPAP. Per the order, pt is to wear her CPAP during test. Pt is aware of this information. Nothing further was needed.

## 2019-10-28 NOTE — Telephone Encounter (Signed)
Overnight oximetry 09/29/2019 Done on room air Significant desaturation with 10 hours spent less than 88%, oxygen desaturation index 15.43. She has a repeat overnight oximetry pending on CPAP.  Please review this when available.

## 2019-11-12 DIAGNOSIS — E119 Type 2 diabetes mellitus without complications: Secondary | ICD-10-CM | POA: Diagnosis not present

## 2019-11-12 DIAGNOSIS — Z7984 Long term (current) use of oral hypoglycemic drugs: Secondary | ICD-10-CM | POA: Diagnosis not present

## 2020-02-10 DIAGNOSIS — R2689 Other abnormalities of gait and mobility: Secondary | ICD-10-CM | POA: Diagnosis not present

## 2020-02-10 DIAGNOSIS — F321 Major depressive disorder, single episode, moderate: Secondary | ICD-10-CM | POA: Diagnosis not present

## 2020-02-10 DIAGNOSIS — E7800 Pure hypercholesterolemia, unspecified: Secondary | ICD-10-CM | POA: Diagnosis not present

## 2020-02-10 DIAGNOSIS — M150 Primary generalized (osteo)arthritis: Secondary | ICD-10-CM | POA: Diagnosis not present

## 2020-02-10 DIAGNOSIS — Z79899 Other long term (current) drug therapy: Secondary | ICD-10-CM | POA: Diagnosis not present

## 2020-02-10 DIAGNOSIS — Z23 Encounter for immunization: Secondary | ICD-10-CM | POA: Diagnosis not present

## 2020-02-10 DIAGNOSIS — E1169 Type 2 diabetes mellitus with other specified complication: Secondary | ICD-10-CM | POA: Diagnosis not present

## 2020-02-10 DIAGNOSIS — M810 Age-related osteoporosis without current pathological fracture: Secondary | ICD-10-CM | POA: Diagnosis not present

## 2020-02-10 DIAGNOSIS — J449 Chronic obstructive pulmonary disease, unspecified: Secondary | ICD-10-CM | POA: Diagnosis not present

## 2020-02-10 DIAGNOSIS — I10 Essential (primary) hypertension: Secondary | ICD-10-CM | POA: Diagnosis not present

## 2020-05-12 DIAGNOSIS — I10 Essential (primary) hypertension: Secondary | ICD-10-CM | POA: Diagnosis not present

## 2020-05-12 DIAGNOSIS — Z79899 Other long term (current) drug therapy: Secondary | ICD-10-CM | POA: Diagnosis not present

## 2020-05-12 DIAGNOSIS — E7800 Pure hypercholesterolemia, unspecified: Secondary | ICD-10-CM | POA: Diagnosis not present

## 2020-05-12 DIAGNOSIS — E1169 Type 2 diabetes mellitus with other specified complication: Secondary | ICD-10-CM | POA: Diagnosis not present

## 2020-07-28 DIAGNOSIS — I10 Essential (primary) hypertension: Secondary | ICD-10-CM | POA: Diagnosis not present

## 2020-07-28 DIAGNOSIS — R600 Localized edema: Secondary | ICD-10-CM | POA: Diagnosis not present

## 2020-08-15 DIAGNOSIS — Z0001 Encounter for general adult medical examination with abnormal findings: Secondary | ICD-10-CM | POA: Diagnosis not present

## 2020-08-15 DIAGNOSIS — E7800 Pure hypercholesterolemia, unspecified: Secondary | ICD-10-CM | POA: Diagnosis not present

## 2020-08-15 DIAGNOSIS — E1169 Type 2 diabetes mellitus with other specified complication: Secondary | ICD-10-CM | POA: Diagnosis not present

## 2020-08-15 DIAGNOSIS — I10 Essential (primary) hypertension: Secondary | ICD-10-CM | POA: Diagnosis not present

## 2020-08-15 DIAGNOSIS — J449 Chronic obstructive pulmonary disease, unspecified: Secondary | ICD-10-CM | POA: Diagnosis not present

## 2020-08-15 DIAGNOSIS — I700 Atherosclerosis of aorta: Secondary | ICD-10-CM | POA: Diagnosis not present

## 2020-08-15 DIAGNOSIS — R2689 Other abnormalities of gait and mobility: Secondary | ICD-10-CM | POA: Diagnosis not present

## 2020-08-15 DIAGNOSIS — Z79899 Other long term (current) drug therapy: Secondary | ICD-10-CM | POA: Diagnosis not present

## 2020-08-15 DIAGNOSIS — F321 Major depressive disorder, single episode, moderate: Secondary | ICD-10-CM | POA: Diagnosis not present

## 2020-08-15 DIAGNOSIS — M810 Age-related osteoporosis without current pathological fracture: Secondary | ICD-10-CM | POA: Diagnosis not present

## 2020-09-20 DIAGNOSIS — N179 Acute kidney failure, unspecified: Secondary | ICD-10-CM | POA: Diagnosis not present

## 2020-09-20 DIAGNOSIS — S12112A Nondisplaced Type II dens fracture, initial encounter for closed fracture: Secondary | ICD-10-CM | POA: Diagnosis not present

## 2020-09-20 DIAGNOSIS — J9622 Acute and chronic respiratory failure with hypercapnia: Secondary | ICD-10-CM | POA: Diagnosis not present

## 2020-09-20 DIAGNOSIS — J9620 Acute and chronic respiratory failure, unspecified whether with hypoxia or hypercapnia: Secondary | ICD-10-CM | POA: Diagnosis not present

## 2020-09-20 DIAGNOSIS — M25562 Pain in left knee: Secondary | ICD-10-CM | POA: Diagnosis not present

## 2020-09-20 DIAGNOSIS — R0902 Hypoxemia: Secondary | ICD-10-CM | POA: Diagnosis not present

## 2020-09-20 DIAGNOSIS — I10 Essential (primary) hypertension: Secondary | ICD-10-CM | POA: Diagnosis not present

## 2020-09-20 DIAGNOSIS — G4733 Obstructive sleep apnea (adult) (pediatric): Secondary | ICD-10-CM | POA: Diagnosis not present

## 2020-09-20 DIAGNOSIS — Z96651 Presence of right artificial knee joint: Secondary | ICD-10-CM | POA: Diagnosis not present

## 2020-09-20 DIAGNOSIS — J984 Other disorders of lung: Secondary | ICD-10-CM | POA: Diagnosis not present

## 2020-09-20 DIAGNOSIS — R531 Weakness: Secondary | ICD-10-CM | POA: Diagnosis not present

## 2020-09-20 DIAGNOSIS — J9621 Acute and chronic respiratory failure with hypoxia: Secondary | ICD-10-CM | POA: Diagnosis not present

## 2020-09-20 DIAGNOSIS — I2510 Atherosclerotic heart disease of native coronary artery without angina pectoris: Secondary | ICD-10-CM | POA: Diagnosis not present

## 2020-09-20 DIAGNOSIS — R296 Repeated falls: Secondary | ICD-10-CM | POA: Diagnosis not present

## 2020-09-20 DIAGNOSIS — E119 Type 2 diabetes mellitus without complications: Secondary | ICD-10-CM | POA: Diagnosis not present

## 2020-09-20 DIAGNOSIS — S12101A Unspecified nondisplaced fracture of second cervical vertebra, initial encounter for closed fracture: Secondary | ICD-10-CM | POA: Diagnosis not present

## 2020-09-20 DIAGNOSIS — J449 Chronic obstructive pulmonary disease, unspecified: Secondary | ICD-10-CM | POA: Diagnosis not present

## 2020-09-20 DIAGNOSIS — G8911 Acute pain due to trauma: Secondary | ICD-10-CM | POA: Diagnosis not present

## 2020-09-20 DIAGNOSIS — N3000 Acute cystitis without hematuria: Secondary | ICD-10-CM | POA: Diagnosis not present

## 2020-09-20 DIAGNOSIS — J9690 Respiratory failure, unspecified, unspecified whether with hypoxia or hypercapnia: Secondary | ICD-10-CM | POA: Diagnosis not present

## 2020-09-20 DIAGNOSIS — E785 Hyperlipidemia, unspecified: Secondary | ICD-10-CM | POA: Diagnosis not present

## 2020-09-20 DIAGNOSIS — Z96652 Presence of left artificial knee joint: Secondary | ICD-10-CM | POA: Diagnosis not present

## 2020-09-20 DIAGNOSIS — R300 Dysuria: Secondary | ICD-10-CM | POA: Diagnosis not present

## 2020-09-20 DIAGNOSIS — M25561 Pain in right knee: Secondary | ICD-10-CM | POA: Diagnosis not present

## 2020-09-20 DIAGNOSIS — I700 Atherosclerosis of aorta: Secondary | ICD-10-CM | POA: Diagnosis not present

## 2020-09-20 DIAGNOSIS — U071 COVID-19: Secondary | ICD-10-CM | POA: Diagnosis not present

## 2020-09-21 DIAGNOSIS — W010XXA Fall on same level from slipping, tripping and stumbling without subsequent striking against object, initial encounter: Secondary | ICD-10-CM | POA: Diagnosis not present

## 2020-09-21 DIAGNOSIS — I10 Essential (primary) hypertension: Secondary | ICD-10-CM | POA: Diagnosis not present

## 2020-09-21 DIAGNOSIS — E119 Type 2 diabetes mellitus without complications: Secondary | ICD-10-CM | POA: Diagnosis not present

## 2020-09-21 DIAGNOSIS — S12112A Nondisplaced Type II dens fracture, initial encounter for closed fracture: Secondary | ICD-10-CM | POA: Diagnosis not present

## 2020-09-21 DIAGNOSIS — J9620 Acute and chronic respiratory failure, unspecified whether with hypoxia or hypercapnia: Secondary | ICD-10-CM | POA: Diagnosis not present

## 2020-09-21 DIAGNOSIS — G4733 Obstructive sleep apnea (adult) (pediatric): Secondary | ICD-10-CM | POA: Diagnosis not present

## 2020-09-21 DIAGNOSIS — N3000 Acute cystitis without hematuria: Secondary | ICD-10-CM | POA: Diagnosis not present

## 2020-09-21 DIAGNOSIS — M542 Cervicalgia: Secondary | ICD-10-CM | POA: Diagnosis not present

## 2020-09-21 DIAGNOSIS — J449 Chronic obstructive pulmonary disease, unspecified: Secondary | ICD-10-CM | POA: Diagnosis not present

## 2020-09-21 DIAGNOSIS — R296 Repeated falls: Secondary | ICD-10-CM | POA: Diagnosis not present

## 2020-09-21 DIAGNOSIS — E785 Hyperlipidemia, unspecified: Secondary | ICD-10-CM | POA: Diagnosis not present

## 2020-09-22 DIAGNOSIS — J9620 Acute and chronic respiratory failure, unspecified whether with hypoxia or hypercapnia: Secondary | ICD-10-CM | POA: Diagnosis not present

## 2020-09-22 DIAGNOSIS — S12101D Unspecified nondisplaced fracture of second cervical vertebra, subsequent encounter for fracture with routine healing: Secondary | ICD-10-CM | POA: Diagnosis not present

## 2020-09-22 DIAGNOSIS — E785 Hyperlipidemia, unspecified: Secondary | ICD-10-CM | POA: Diagnosis present

## 2020-09-22 DIAGNOSIS — Z9981 Dependence on supplemental oxygen: Secondary | ICD-10-CM | POA: Diagnosis not present

## 2020-09-22 DIAGNOSIS — Z7982 Long term (current) use of aspirin: Secondary | ICD-10-CM | POA: Diagnosis not present

## 2020-09-22 DIAGNOSIS — J449 Chronic obstructive pulmonary disease, unspecified: Secondary | ICD-10-CM | POA: Diagnosis present

## 2020-09-22 DIAGNOSIS — Z981 Arthrodesis status: Secondary | ICD-10-CM | POA: Diagnosis not present

## 2020-09-22 DIAGNOSIS — Z7984 Long term (current) use of oral hypoglycemic drugs: Secondary | ICD-10-CM | POA: Diagnosis not present

## 2020-09-22 DIAGNOSIS — J9622 Acute and chronic respiratory failure with hypercapnia: Secondary | ICD-10-CM | POA: Diagnosis present

## 2020-09-22 DIAGNOSIS — I110 Hypertensive heart disease with heart failure: Secondary | ICD-10-CM | POA: Diagnosis present

## 2020-09-22 DIAGNOSIS — R262 Difficulty in walking, not elsewhere classified: Secondary | ICD-10-CM | POA: Diagnosis not present

## 2020-09-22 DIAGNOSIS — N179 Acute kidney failure, unspecified: Secondary | ICD-10-CM | POA: Diagnosis not present

## 2020-09-22 DIAGNOSIS — R41841 Cognitive communication deficit: Secondary | ICD-10-CM | POA: Diagnosis not present

## 2020-09-22 DIAGNOSIS — B961 Klebsiella pneumoniae [K. pneumoniae] as the cause of diseases classified elsewhere: Secondary | ICD-10-CM | POA: Diagnosis present

## 2020-09-22 DIAGNOSIS — J90 Pleural effusion, not elsewhere classified: Secondary | ICD-10-CM | POA: Diagnosis not present

## 2020-09-22 DIAGNOSIS — M1990 Unspecified osteoarthritis, unspecified site: Secondary | ICD-10-CM | POA: Diagnosis present

## 2020-09-22 DIAGNOSIS — M25511 Pain in right shoulder: Secondary | ICD-10-CM | POA: Diagnosis not present

## 2020-09-22 DIAGNOSIS — K5900 Constipation, unspecified: Secondary | ICD-10-CM | POA: Diagnosis not present

## 2020-09-22 DIAGNOSIS — Z9989 Dependence on other enabling machines and devices: Secondary | ICD-10-CM | POA: Diagnosis not present

## 2020-09-22 DIAGNOSIS — S12112D Nondisplaced Type II dens fracture, subsequent encounter for fracture with routine healing: Secondary | ICD-10-CM | POA: Diagnosis not present

## 2020-09-22 DIAGNOSIS — S12112A Nondisplaced Type II dens fracture, initial encounter for closed fracture: Secondary | ICD-10-CM | POA: Diagnosis present

## 2020-09-22 DIAGNOSIS — N3000 Acute cystitis without hematuria: Secondary | ICD-10-CM | POA: Diagnosis present

## 2020-09-22 DIAGNOSIS — E119 Type 2 diabetes mellitus without complications: Secondary | ICD-10-CM | POA: Diagnosis present

## 2020-09-22 DIAGNOSIS — M47812 Spondylosis without myelopathy or radiculopathy, cervical region: Secondary | ICD-10-CM | POA: Diagnosis present

## 2020-09-22 DIAGNOSIS — R296 Repeated falls: Secondary | ICD-10-CM | POA: Diagnosis present

## 2020-09-22 DIAGNOSIS — R918 Other nonspecific abnormal finding of lung field: Secondary | ICD-10-CM | POA: Diagnosis not present

## 2020-09-22 DIAGNOSIS — I700 Atherosclerosis of aorta: Secondary | ICD-10-CM | POA: Diagnosis present

## 2020-09-22 DIAGNOSIS — J42 Unspecified chronic bronchitis: Secondary | ICD-10-CM | POA: Diagnosis not present

## 2020-09-22 DIAGNOSIS — S12111A Posterior displaced Type II dens fracture, initial encounter for closed fracture: Secondary | ICD-10-CM | POA: Diagnosis not present

## 2020-09-22 DIAGNOSIS — U071 COVID-19: Secondary | ICD-10-CM | POA: Diagnosis present

## 2020-09-22 DIAGNOSIS — S12110A Anterior displaced Type II dens fracture, initial encounter for closed fracture: Secondary | ICD-10-CM | POA: Diagnosis not present

## 2020-09-22 DIAGNOSIS — J9621 Acute and chronic respiratory failure with hypoxia: Secondary | ICD-10-CM | POA: Diagnosis present

## 2020-09-22 DIAGNOSIS — K219 Gastro-esophageal reflux disease without esophagitis: Secondary | ICD-10-CM | POA: Diagnosis not present

## 2020-09-22 DIAGNOSIS — Z66 Do not resuscitate: Secondary | ICD-10-CM | POA: Diagnosis present

## 2020-09-22 DIAGNOSIS — M25561 Pain in right knee: Secondary | ICD-10-CM | POA: Diagnosis present

## 2020-09-22 DIAGNOSIS — D6869 Other thrombophilia: Secondary | ICD-10-CM | POA: Diagnosis not present

## 2020-09-22 DIAGNOSIS — I509 Heart failure, unspecified: Secondary | ICD-10-CM | POA: Diagnosis not present

## 2020-09-22 DIAGNOSIS — Z96611 Presence of right artificial shoulder joint: Secondary | ICD-10-CM | POA: Diagnosis not present

## 2020-09-22 DIAGNOSIS — M25562 Pain in left knee: Secondary | ICD-10-CM | POA: Diagnosis present

## 2020-09-22 DIAGNOSIS — M8588 Other specified disorders of bone density and structure, other site: Secondary | ICD-10-CM | POA: Diagnosis present

## 2020-09-22 DIAGNOSIS — J9811 Atelectasis: Secondary | ICD-10-CM | POA: Diagnosis not present

## 2020-09-22 DIAGNOSIS — E875 Hyperkalemia: Secondary | ICD-10-CM | POA: Diagnosis not present

## 2020-09-22 DIAGNOSIS — M6281 Muscle weakness (generalized): Secondary | ICD-10-CM | POA: Diagnosis not present

## 2020-09-22 DIAGNOSIS — I10 Essential (primary) hypertension: Secondary | ICD-10-CM | POA: Diagnosis not present

## 2020-09-22 DIAGNOSIS — S12100A Unspecified displaced fracture of second cervical vertebra, initial encounter for closed fracture: Secondary | ICD-10-CM | POA: Diagnosis not present

## 2020-09-22 DIAGNOSIS — Z9181 History of falling: Secondary | ICD-10-CM | POA: Diagnosis not present

## 2020-09-22 DIAGNOSIS — J9611 Chronic respiratory failure with hypoxia: Secondary | ICD-10-CM | POA: Diagnosis not present

## 2020-09-22 DIAGNOSIS — G4733 Obstructive sleep apnea (adult) (pediatric): Secondary | ICD-10-CM | POA: Diagnosis present

## 2020-09-22 DIAGNOSIS — G319 Degenerative disease of nervous system, unspecified: Secondary | ICD-10-CM | POA: Diagnosis present

## 2020-09-22 DIAGNOSIS — R2681 Unsteadiness on feet: Secondary | ICD-10-CM | POA: Diagnosis not present

## 2020-09-22 DIAGNOSIS — Z87891 Personal history of nicotine dependence: Secondary | ICD-10-CM | POA: Diagnosis not present

## 2020-09-22 DIAGNOSIS — I2510 Atherosclerotic heart disease of native coronary artery without angina pectoris: Secondary | ICD-10-CM | POA: Diagnosis present

## 2020-10-05 DIAGNOSIS — M6281 Muscle weakness (generalized): Secondary | ICD-10-CM | POA: Diagnosis not present

## 2020-10-05 DIAGNOSIS — S12112D Nondisplaced Type II dens fracture, subsequent encounter for fracture with routine healing: Secondary | ICD-10-CM | POA: Diagnosis not present

## 2020-10-05 DIAGNOSIS — R41841 Cognitive communication deficit: Secondary | ICD-10-CM | POA: Diagnosis not present

## 2020-10-05 DIAGNOSIS — R5381 Other malaise: Secondary | ICD-10-CM | POA: Diagnosis not present

## 2020-10-05 DIAGNOSIS — D649 Anemia, unspecified: Secondary | ICD-10-CM | POA: Diagnosis not present

## 2020-10-05 DIAGNOSIS — N390 Urinary tract infection, site not specified: Secondary | ICD-10-CM | POA: Diagnosis not present

## 2020-10-05 DIAGNOSIS — E875 Hyperkalemia: Secondary | ICD-10-CM | POA: Diagnosis not present

## 2020-10-05 DIAGNOSIS — E785 Hyperlipidemia, unspecified: Secondary | ICD-10-CM | POA: Diagnosis not present

## 2020-10-05 DIAGNOSIS — J9620 Acute and chronic respiratory failure, unspecified whether with hypoxia or hypercapnia: Secondary | ICD-10-CM | POA: Diagnosis not present

## 2020-10-05 DIAGNOSIS — Z981 Arthrodesis status: Secondary | ICD-10-CM | POA: Diagnosis not present

## 2020-10-05 DIAGNOSIS — J42 Unspecified chronic bronchitis: Secondary | ICD-10-CM | POA: Diagnosis not present

## 2020-10-05 DIAGNOSIS — M47812 Spondylosis without myelopathy or radiculopathy, cervical region: Secondary | ICD-10-CM | POA: Diagnosis not present

## 2020-10-05 DIAGNOSIS — G4700 Insomnia, unspecified: Secondary | ICD-10-CM | POA: Diagnosis not present

## 2020-10-05 DIAGNOSIS — I110 Hypertensive heart disease with heart failure: Secondary | ICD-10-CM | POA: Diagnosis not present

## 2020-10-05 DIAGNOSIS — R262 Difficulty in walking, not elsewhere classified: Secondary | ICD-10-CM | POA: Diagnosis not present

## 2020-10-05 DIAGNOSIS — R339 Retention of urine, unspecified: Secondary | ICD-10-CM | POA: Diagnosis not present

## 2020-10-05 DIAGNOSIS — J150 Pneumonia due to Klebsiella pneumoniae: Secondary | ICD-10-CM | POA: Diagnosis not present

## 2020-10-05 DIAGNOSIS — Z7982 Long term (current) use of aspirin: Secondary | ICD-10-CM | POA: Diagnosis not present

## 2020-10-05 DIAGNOSIS — G4733 Obstructive sleep apnea (adult) (pediatric): Secondary | ICD-10-CM | POA: Diagnosis not present

## 2020-10-05 DIAGNOSIS — E119 Type 2 diabetes mellitus without complications: Secondary | ICD-10-CM | POA: Diagnosis not present

## 2020-10-05 DIAGNOSIS — J9611 Chronic respiratory failure with hypoxia: Secondary | ICD-10-CM | POA: Diagnosis not present

## 2020-10-05 DIAGNOSIS — E860 Dehydration: Secondary | ICD-10-CM | POA: Diagnosis not present

## 2020-10-05 DIAGNOSIS — K5900 Constipation, unspecified: Secondary | ICD-10-CM | POA: Diagnosis not present

## 2020-10-05 DIAGNOSIS — H04123 Dry eye syndrome of bilateral lacrimal glands: Secondary | ICD-10-CM | POA: Diagnosis not present

## 2020-10-05 DIAGNOSIS — E1165 Type 2 diabetes mellitus with hyperglycemia: Secondary | ICD-10-CM | POA: Diagnosis not present

## 2020-10-05 DIAGNOSIS — S12110D Anterior displaced Type II dens fracture, subsequent encounter for fracture with routine healing: Secondary | ICD-10-CM | POA: Diagnosis not present

## 2020-10-05 DIAGNOSIS — J90 Pleural effusion, not elsewhere classified: Secondary | ICD-10-CM | POA: Diagnosis not present

## 2020-10-05 DIAGNOSIS — S12112A Nondisplaced Type II dens fracture, initial encounter for closed fracture: Secondary | ICD-10-CM | POA: Diagnosis not present

## 2020-10-05 DIAGNOSIS — I509 Heart failure, unspecified: Secondary | ICD-10-CM | POA: Diagnosis not present

## 2020-10-05 DIAGNOSIS — R2681 Unsteadiness on feet: Secondary | ICD-10-CM | POA: Diagnosis not present

## 2020-10-05 DIAGNOSIS — I10 Essential (primary) hypertension: Secondary | ICD-10-CM | POA: Diagnosis not present

## 2020-10-05 DIAGNOSIS — Z9981 Dependence on supplemental oxygen: Secondary | ICD-10-CM | POA: Diagnosis not present

## 2020-10-05 DIAGNOSIS — Z7984 Long term (current) use of oral hypoglycemic drugs: Secondary | ICD-10-CM | POA: Diagnosis not present

## 2020-10-05 DIAGNOSIS — K921 Melena: Secondary | ICD-10-CM | POA: Diagnosis not present

## 2020-10-05 DIAGNOSIS — U071 COVID-19: Secondary | ICD-10-CM | POA: Diagnosis not present

## 2020-10-05 DIAGNOSIS — Z9989 Dependence on other enabling machines and devices: Secondary | ICD-10-CM | POA: Diagnosis not present

## 2020-10-05 DIAGNOSIS — J449 Chronic obstructive pulmonary disease, unspecified: Secondary | ICD-10-CM | POA: Diagnosis not present

## 2020-10-05 DIAGNOSIS — Z9181 History of falling: Secondary | ICD-10-CM | POA: Diagnosis not present

## 2020-10-05 DIAGNOSIS — Z09 Encounter for follow-up examination after completed treatment for conditions other than malignant neoplasm: Secondary | ICD-10-CM | POA: Diagnosis not present

## 2020-10-05 DIAGNOSIS — R0902 Hypoxemia: Secondary | ICD-10-CM | POA: Diagnosis not present

## 2020-10-05 DIAGNOSIS — R630 Anorexia: Secondary | ICD-10-CM | POA: Diagnosis not present

## 2020-10-05 DIAGNOSIS — K649 Unspecified hemorrhoids: Secondary | ICD-10-CM | POA: Diagnosis not present

## 2020-10-05 DIAGNOSIS — R609 Edema, unspecified: Secondary | ICD-10-CM | POA: Diagnosis not present

## 2020-10-09 DIAGNOSIS — J9620 Acute and chronic respiratory failure, unspecified whether with hypoxia or hypercapnia: Secondary | ICD-10-CM | POA: Diagnosis not present

## 2020-10-09 DIAGNOSIS — H04123 Dry eye syndrome of bilateral lacrimal glands: Secondary | ICD-10-CM | POA: Diagnosis not present

## 2020-10-09 DIAGNOSIS — N390 Urinary tract infection, site not specified: Secondary | ICD-10-CM | POA: Diagnosis not present

## 2020-10-09 DIAGNOSIS — J150 Pneumonia due to Klebsiella pneumoniae: Secondary | ICD-10-CM | POA: Diagnosis not present

## 2020-10-09 DIAGNOSIS — E119 Type 2 diabetes mellitus without complications: Secondary | ICD-10-CM | POA: Diagnosis not present

## 2020-10-09 DIAGNOSIS — S12110D Anterior displaced Type II dens fracture, subsequent encounter for fracture with routine healing: Secondary | ICD-10-CM | POA: Diagnosis not present

## 2020-10-09 DIAGNOSIS — U071 COVID-19: Secondary | ICD-10-CM | POA: Diagnosis not present

## 2020-10-09 DIAGNOSIS — R5381 Other malaise: Secondary | ICD-10-CM | POA: Diagnosis not present

## 2020-10-09 DIAGNOSIS — I10 Essential (primary) hypertension: Secondary | ICD-10-CM | POA: Diagnosis not present

## 2020-10-09 DIAGNOSIS — J449 Chronic obstructive pulmonary disease, unspecified: Secondary | ICD-10-CM | POA: Diagnosis not present

## 2020-10-11 DIAGNOSIS — J150 Pneumonia due to Klebsiella pneumoniae: Secondary | ICD-10-CM | POA: Diagnosis not present

## 2020-10-11 DIAGNOSIS — N390 Urinary tract infection, site not specified: Secondary | ICD-10-CM | POA: Diagnosis not present

## 2020-10-11 DIAGNOSIS — H04123 Dry eye syndrome of bilateral lacrimal glands: Secondary | ICD-10-CM | POA: Diagnosis not present

## 2020-10-11 DIAGNOSIS — J90 Pleural effusion, not elsewhere classified: Secondary | ICD-10-CM | POA: Diagnosis not present

## 2020-10-11 DIAGNOSIS — K5900 Constipation, unspecified: Secondary | ICD-10-CM | POA: Diagnosis not present

## 2020-10-11 DIAGNOSIS — D649 Anemia, unspecified: Secondary | ICD-10-CM | POA: Diagnosis not present

## 2020-10-11 DIAGNOSIS — U071 COVID-19: Secondary | ICD-10-CM | POA: Diagnosis not present

## 2020-10-11 DIAGNOSIS — R5381 Other malaise: Secondary | ICD-10-CM | POA: Diagnosis not present

## 2020-10-11 DIAGNOSIS — E875 Hyperkalemia: Secondary | ICD-10-CM | POA: Diagnosis not present

## 2020-10-11 DIAGNOSIS — R630 Anorexia: Secondary | ICD-10-CM | POA: Diagnosis not present

## 2020-10-16 DIAGNOSIS — J90 Pleural effusion, not elsewhere classified: Secondary | ICD-10-CM | POA: Diagnosis not present

## 2020-10-16 DIAGNOSIS — J9620 Acute and chronic respiratory failure, unspecified whether with hypoxia or hypercapnia: Secondary | ICD-10-CM | POA: Diagnosis not present

## 2020-10-16 DIAGNOSIS — R5381 Other malaise: Secondary | ICD-10-CM | POA: Diagnosis not present

## 2020-10-16 DIAGNOSIS — U071 COVID-19: Secondary | ICD-10-CM | POA: Diagnosis not present

## 2020-10-16 DIAGNOSIS — N390 Urinary tract infection, site not specified: Secondary | ICD-10-CM | POA: Diagnosis not present

## 2020-10-16 DIAGNOSIS — J449 Chronic obstructive pulmonary disease, unspecified: Secondary | ICD-10-CM | POA: Diagnosis not present

## 2020-10-16 DIAGNOSIS — E119 Type 2 diabetes mellitus without complications: Secondary | ICD-10-CM | POA: Diagnosis not present

## 2020-10-16 DIAGNOSIS — I10 Essential (primary) hypertension: Secondary | ICD-10-CM | POA: Diagnosis not present

## 2020-10-16 DIAGNOSIS — K5900 Constipation, unspecified: Secondary | ICD-10-CM | POA: Diagnosis not present

## 2020-10-16 DIAGNOSIS — J150 Pneumonia due to Klebsiella pneumoniae: Secondary | ICD-10-CM | POA: Diagnosis not present

## 2020-10-23 DIAGNOSIS — J90 Pleural effusion, not elsewhere classified: Secondary | ICD-10-CM | POA: Diagnosis not present

## 2020-10-23 DIAGNOSIS — J449 Chronic obstructive pulmonary disease, unspecified: Secondary | ICD-10-CM | POA: Diagnosis not present

## 2020-10-23 DIAGNOSIS — J150 Pneumonia due to Klebsiella pneumoniae: Secondary | ICD-10-CM | POA: Diagnosis not present

## 2020-10-23 DIAGNOSIS — E119 Type 2 diabetes mellitus without complications: Secondary | ICD-10-CM | POA: Diagnosis not present

## 2020-10-23 DIAGNOSIS — U071 COVID-19: Secondary | ICD-10-CM | POA: Diagnosis not present

## 2020-10-23 DIAGNOSIS — I10 Essential (primary) hypertension: Secondary | ICD-10-CM | POA: Diagnosis not present

## 2020-10-23 DIAGNOSIS — N390 Urinary tract infection, site not specified: Secondary | ICD-10-CM | POA: Diagnosis not present

## 2020-10-23 DIAGNOSIS — E875 Hyperkalemia: Secondary | ICD-10-CM | POA: Diagnosis not present

## 2020-10-23 DIAGNOSIS — J9620 Acute and chronic respiratory failure, unspecified whether with hypoxia or hypercapnia: Secondary | ICD-10-CM | POA: Diagnosis not present

## 2020-10-23 DIAGNOSIS — K5900 Constipation, unspecified: Secondary | ICD-10-CM | POA: Diagnosis not present

## 2020-10-23 DIAGNOSIS — G4733 Obstructive sleep apnea (adult) (pediatric): Secondary | ICD-10-CM | POA: Diagnosis not present

## 2020-10-28 DIAGNOSIS — I509 Heart failure, unspecified: Secondary | ICD-10-CM | POA: Diagnosis not present

## 2020-10-28 DIAGNOSIS — D649 Anemia, unspecified: Secondary | ICD-10-CM | POA: Diagnosis not present

## 2020-10-28 DIAGNOSIS — J449 Chronic obstructive pulmonary disease, unspecified: Secondary | ICD-10-CM | POA: Diagnosis not present

## 2020-10-28 DIAGNOSIS — I10 Essential (primary) hypertension: Secondary | ICD-10-CM | POA: Diagnosis not present

## 2020-10-28 DIAGNOSIS — E119 Type 2 diabetes mellitus without complications: Secondary | ICD-10-CM | POA: Diagnosis not present

## 2020-11-01 DIAGNOSIS — I509 Heart failure, unspecified: Secondary | ICD-10-CM | POA: Diagnosis not present

## 2020-11-01 DIAGNOSIS — K5900 Constipation, unspecified: Secondary | ICD-10-CM | POA: Diagnosis not present

## 2020-11-01 DIAGNOSIS — N390 Urinary tract infection, site not specified: Secondary | ICD-10-CM | POA: Diagnosis not present

## 2020-11-01 DIAGNOSIS — J150 Pneumonia due to Klebsiella pneumoniae: Secondary | ICD-10-CM | POA: Diagnosis not present

## 2020-11-01 DIAGNOSIS — H04123 Dry eye syndrome of bilateral lacrimal glands: Secondary | ICD-10-CM | POA: Diagnosis not present

## 2020-11-01 DIAGNOSIS — G4700 Insomnia, unspecified: Secondary | ICD-10-CM | POA: Diagnosis not present

## 2020-11-01 DIAGNOSIS — I10 Essential (primary) hypertension: Secondary | ICD-10-CM | POA: Diagnosis not present

## 2020-11-01 DIAGNOSIS — J449 Chronic obstructive pulmonary disease, unspecified: Secondary | ICD-10-CM | POA: Diagnosis not present

## 2020-11-01 DIAGNOSIS — R609 Edema, unspecified: Secondary | ICD-10-CM | POA: Diagnosis not present

## 2020-11-03 DIAGNOSIS — Z981 Arthrodesis status: Secondary | ICD-10-CM | POA: Diagnosis not present

## 2020-11-03 DIAGNOSIS — Z09 Encounter for follow-up examination after completed treatment for conditions other than malignant neoplasm: Secondary | ICD-10-CM | POA: Diagnosis not present

## 2020-11-09 DIAGNOSIS — R5381 Other malaise: Secondary | ICD-10-CM | POA: Diagnosis not present

## 2020-11-09 DIAGNOSIS — E1165 Type 2 diabetes mellitus with hyperglycemia: Secondary | ICD-10-CM | POA: Diagnosis not present

## 2020-11-09 DIAGNOSIS — I10 Essential (primary) hypertension: Secondary | ICD-10-CM | POA: Diagnosis not present

## 2020-11-09 DIAGNOSIS — J449 Chronic obstructive pulmonary disease, unspecified: Secondary | ICD-10-CM | POA: Diagnosis not present

## 2020-11-16 DIAGNOSIS — I509 Heart failure, unspecified: Secondary | ICD-10-CM | POA: Diagnosis not present

## 2020-11-16 DIAGNOSIS — D649 Anemia, unspecified: Secondary | ICD-10-CM | POA: Diagnosis not present

## 2020-11-16 DIAGNOSIS — E1165 Type 2 diabetes mellitus with hyperglycemia: Secondary | ICD-10-CM | POA: Diagnosis not present

## 2020-11-16 DIAGNOSIS — J449 Chronic obstructive pulmonary disease, unspecified: Secondary | ICD-10-CM | POA: Diagnosis not present

## 2020-11-16 DIAGNOSIS — I10 Essential (primary) hypertension: Secondary | ICD-10-CM | POA: Diagnosis not present

## 2020-11-16 DIAGNOSIS — E119 Type 2 diabetes mellitus without complications: Secondary | ICD-10-CM | POA: Diagnosis not present

## 2020-11-20 DIAGNOSIS — R630 Anorexia: Secondary | ICD-10-CM | POA: Diagnosis not present

## 2020-11-20 DIAGNOSIS — J449 Chronic obstructive pulmonary disease, unspecified: Secondary | ICD-10-CM | POA: Diagnosis not present

## 2020-11-20 DIAGNOSIS — K921 Melena: Secondary | ICD-10-CM | POA: Diagnosis not present

## 2020-11-20 DIAGNOSIS — E875 Hyperkalemia: Secondary | ICD-10-CM | POA: Diagnosis not present

## 2020-11-20 DIAGNOSIS — D649 Anemia, unspecified: Secondary | ICD-10-CM | POA: Diagnosis not present

## 2020-11-20 DIAGNOSIS — E860 Dehydration: Secondary | ICD-10-CM | POA: Diagnosis not present

## 2020-11-20 DIAGNOSIS — G4733 Obstructive sleep apnea (adult) (pediatric): Secondary | ICD-10-CM | POA: Diagnosis not present

## 2020-11-20 DIAGNOSIS — K5900 Constipation, unspecified: Secondary | ICD-10-CM | POA: Diagnosis not present

## 2020-11-20 DIAGNOSIS — N390 Urinary tract infection, site not specified: Secondary | ICD-10-CM | POA: Diagnosis not present

## 2020-11-27 DIAGNOSIS — E119 Type 2 diabetes mellitus without complications: Secondary | ICD-10-CM | POA: Diagnosis not present

## 2020-11-27 DIAGNOSIS — R5381 Other malaise: Secondary | ICD-10-CM | POA: Diagnosis not present

## 2020-11-27 DIAGNOSIS — R609 Edema, unspecified: Secondary | ICD-10-CM | POA: Diagnosis not present

## 2020-11-27 DIAGNOSIS — K649 Unspecified hemorrhoids: Secondary | ICD-10-CM | POA: Diagnosis not present

## 2020-11-27 DIAGNOSIS — U071 COVID-19: Secondary | ICD-10-CM | POA: Diagnosis not present

## 2020-11-27 DIAGNOSIS — I10 Essential (primary) hypertension: Secondary | ICD-10-CM | POA: Diagnosis not present

## 2020-11-27 DIAGNOSIS — N390 Urinary tract infection, site not specified: Secondary | ICD-10-CM | POA: Diagnosis not present

## 2020-11-27 DIAGNOSIS — D649 Anemia, unspecified: Secondary | ICD-10-CM | POA: Diagnosis not present

## 2020-11-27 DIAGNOSIS — J449 Chronic obstructive pulmonary disease, unspecified: Secondary | ICD-10-CM | POA: Diagnosis not present

## 2020-11-27 DIAGNOSIS — K5900 Constipation, unspecified: Secondary | ICD-10-CM | POA: Diagnosis not present

## 2020-11-27 DIAGNOSIS — E875 Hyperkalemia: Secondary | ICD-10-CM | POA: Diagnosis not present

## 2020-11-28 DIAGNOSIS — S12112D Nondisplaced Type II dens fracture, subsequent encounter for fracture with routine healing: Secondary | ICD-10-CM | POA: Diagnosis not present

## 2020-11-28 DIAGNOSIS — M47812 Spondylosis without myelopathy or radiculopathy, cervical region: Secondary | ICD-10-CM | POA: Diagnosis not present

## 2020-11-28 DIAGNOSIS — Z981 Arthrodesis status: Secondary | ICD-10-CM | POA: Diagnosis not present

## 2020-12-11 DIAGNOSIS — K5909 Other constipation: Secondary | ICD-10-CM | POA: Diagnosis not present

## 2020-12-11 DIAGNOSIS — N390 Urinary tract infection, site not specified: Secondary | ICD-10-CM | POA: Diagnosis not present

## 2020-12-11 DIAGNOSIS — I10 Essential (primary) hypertension: Secondary | ICD-10-CM | POA: Diagnosis not present

## 2020-12-11 DIAGNOSIS — R609 Edema, unspecified: Secondary | ICD-10-CM | POA: Diagnosis not present

## 2020-12-11 DIAGNOSIS — J449 Chronic obstructive pulmonary disease, unspecified: Secondary | ICD-10-CM | POA: Diagnosis not present

## 2020-12-11 DIAGNOSIS — U071 COVID-19: Secondary | ICD-10-CM | POA: Diagnosis not present

## 2020-12-11 DIAGNOSIS — R5381 Other malaise: Secondary | ICD-10-CM | POA: Diagnosis not present

## 2020-12-11 DIAGNOSIS — E119 Type 2 diabetes mellitus without complications: Secondary | ICD-10-CM | POA: Diagnosis not present

## 2020-12-11 DIAGNOSIS — D649 Anemia, unspecified: Secondary | ICD-10-CM | POA: Diagnosis not present

## 2020-12-11 DIAGNOSIS — K649 Unspecified hemorrhoids: Secondary | ICD-10-CM | POA: Diagnosis not present

## 2020-12-13 DIAGNOSIS — R110 Nausea: Secondary | ICD-10-CM | POA: Diagnosis not present

## 2020-12-13 DIAGNOSIS — R0989 Other specified symptoms and signs involving the circulatory and respiratory systems: Secondary | ICD-10-CM | POA: Diagnosis not present
